# Patient Record
Sex: Male | Born: 1935 | Race: White | Hispanic: No | Marital: Married | Smoking: Former smoker
Health system: Southern US, Community
[De-identification: ages and names within clinical notes are randomized; demographics above are authoritative.]

## PROBLEM LIST (undated history)

## (undated) DIAGNOSIS — H9190 Unspecified hearing loss, unspecified ear: Secondary | ICD-10-CM

## (undated) DIAGNOSIS — H3530 Unspecified macular degeneration: Secondary | ICD-10-CM

## (undated) DIAGNOSIS — Z9889 Other specified postprocedural states: Secondary | ICD-10-CM

## (undated) DIAGNOSIS — N200 Calculus of kidney: Secondary | ICD-10-CM

## (undated) DIAGNOSIS — I10 Essential (primary) hypertension: Secondary | ICD-10-CM

## (undated) DIAGNOSIS — I429 Cardiomyopathy, unspecified: Secondary | ICD-10-CM

## (undated) DIAGNOSIS — R001 Bradycardia, unspecified: Secondary | ICD-10-CM

## (undated) DIAGNOSIS — I493 Ventricular premature depolarization: Secondary | ICD-10-CM

## (undated) DIAGNOSIS — I480 Paroxysmal atrial fibrillation: Secondary | ICD-10-CM

## (undated) DIAGNOSIS — I2582 Chronic total occlusion of coronary artery: Secondary | ICD-10-CM

## (undated) DIAGNOSIS — E785 Hyperlipidemia, unspecified: Secondary | ICD-10-CM

## (undated) DIAGNOSIS — K635 Polyp of colon: Secondary | ICD-10-CM

## (undated) HISTORY — PX: TONSILLECTOMY: SHX5217

## (undated) HISTORY — DX: Hyperlipidemia, unspecified: E78.5

## (undated) HISTORY — DX: Polyp of colon: K63.5

## (undated) HISTORY — DX: Ventricular premature depolarization: I49.3

## (undated) HISTORY — DX: Cardiomyopathy, unspecified: I42.9

## (undated) HISTORY — DX: Essential (primary) hypertension: I10

## (undated) HISTORY — DX: Unspecified hearing loss, unspecified ear: H91.90

## (undated) HISTORY — DX: Calculus of kidney: N20.0

## (undated) HISTORY — DX: Other specified postprocedural states: Z98.890

## (undated) HISTORY — DX: Unspecified macular degeneration: H35.30

## (undated) HISTORY — DX: Bradycardia, unspecified: R00.1

## (undated) SURGERY — CARDIOVERSION
Anesthesia: Monitor Anesthesia Care

---
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
## 1989-08-02 HISTORY — PX: CARDIAC CATHETERIZATION: SHX172

## 2001-08-08 ENCOUNTER — Encounter: Payer: Self-pay | Admitting: Urology

## 2001-08-08 ENCOUNTER — Ambulatory Visit (HOSPITAL_BASED_OUTPATIENT_CLINIC_OR_DEPARTMENT_OTHER): Admission: RE | Admit: 2001-08-08 | Discharge: 2001-08-08 | Payer: Self-pay | Admitting: Urology

## 2001-08-08 HISTORY — PX: CYSTOSCOPY W/ RETROGRADES: SHX1426

## 2001-08-28 ENCOUNTER — Ambulatory Visit (HOSPITAL_BASED_OUTPATIENT_CLINIC_OR_DEPARTMENT_OTHER): Admission: RE | Admit: 2001-08-28 | Discharge: 2001-08-28 | Payer: Self-pay | Admitting: Urology

## 2001-08-28 ENCOUNTER — Encounter: Payer: Self-pay | Admitting: Urology

## 2002-06-21 ENCOUNTER — Encounter: Payer: Self-pay | Admitting: Urology

## 2002-06-21 ENCOUNTER — Ambulatory Visit (HOSPITAL_BASED_OUTPATIENT_CLINIC_OR_DEPARTMENT_OTHER): Admission: RE | Admit: 2002-06-21 | Discharge: 2002-06-21 | Payer: Self-pay | Admitting: Urology

## 2003-07-19 ENCOUNTER — Ambulatory Visit (HOSPITAL_COMMUNITY): Admission: RE | Admit: 2003-07-19 | Discharge: 2003-07-19 | Payer: Self-pay | Admitting: Urology

## 2003-07-19 ENCOUNTER — Ambulatory Visit (HOSPITAL_BASED_OUTPATIENT_CLINIC_OR_DEPARTMENT_OTHER): Admission: RE | Admit: 2003-07-19 | Discharge: 2003-07-19 | Payer: Self-pay | Admitting: Urology

## 2003-07-19 HISTORY — PX: CYSTOSCOPY W/ RETROGRADES: SHX1426

## 2003-07-25 ENCOUNTER — Ambulatory Visit (HOSPITAL_COMMUNITY): Admission: RE | Admit: 2003-07-25 | Discharge: 2003-07-25 | Payer: Self-pay | Admitting: Urology

## 2010-09-01 ENCOUNTER — Encounter: Payer: Self-pay | Admitting: *Deleted

## 2010-09-01 ENCOUNTER — Ambulatory Visit (INDEPENDENT_AMBULATORY_CARE_PROVIDER_SITE_OTHER): Payer: BC Managed Care – PPO | Admitting: Cardiology

## 2010-09-01 ENCOUNTER — Encounter: Payer: Self-pay | Admitting: Cardiology

## 2010-09-01 DIAGNOSIS — I10 Essential (primary) hypertension: Secondary | ICD-10-CM

## 2010-09-01 DIAGNOSIS — I2510 Atherosclerotic heart disease of native coronary artery without angina pectoris: Secondary | ICD-10-CM | POA: Insufficient documentation

## 2010-09-01 DIAGNOSIS — E7800 Pure hypercholesterolemia, unspecified: Secondary | ICD-10-CM | POA: Insufficient documentation

## 2010-09-01 NOTE — Progress Notes (Signed)
Subjective:   Christian Gillespie is seen today for a one-year followup visit. He has known ischemic heart disease and had remote catheterization in 1991 which showed a totally occluded right coronary artery and somewhat diffuse disease in the left system. He has not allow me to do followup catheterization since that time. Her last stress test was in 1998 and he has remained asymptomatic and continues to do well. He has been on pravastatin therapy  Current Outpatient Prescriptions  Medication Sig Dispense Refill  . amLODipine (NORVASC) 5 MG tablet Take 5 mg by mouth daily.        Marland Kitchen aspirin 81 MG tablet Take 81 mg by mouth daily.        Marland Kitchen atenolol (TENORMIN) 50 MG tablet Take 50 mg by mouth daily.        . pravastatin (PRAVACHOL) 80 MG tablet Take 80 mg by mouth daily.        Marland Kitchen DISCONTD: niacin (NIASPAN) 500 MG CR tablet Take 500 mg by mouth at bedtime.          Allergies  Allergen Reactions  . Codeine     There is no problem list on file for this patient.   History  Smoking status  . Former Smoker  . Types: Cigarettes  . Quit date: 03/22/1985  Smokeless tobacco  . Never Used    History  Alcohol Use No    Family History  Problem Relation Age of Onset  . Breast cancer Mother     Review of Systems:   The patient denies any heat or cold intolerance.  No weight gain or weight loss.  The patient denies headaches or blurry vision.  There is no cough or sputum production.  The patient denies dizziness.  There is no hematuria or hematochezia.  The patient denies any muscle aches or arthritis.  The patient denies any rash.  The patient denies frequent falling or instability.  There is no history of depression or anxiety.  All other systems were reviewed and are negative.   Physical Exam:   Weight is 192. Blood pressure 120/62. Heart rate 60.The head is normocephalic and atraumatic.  Pupils are equally round and reactive to light.  Sclerae nonicteric.  Conjunctiva is clear.  Oropharynx is  unremarkable.  There's adequate oral airway.  Neck is supple there are no masses.  Thyroid is not enlarged.  There is no lymphadenopathy.  Lungs are clear.  Chest is symmetric.  Heart shows a regular rate and rhythm.  S1 and S2 are normal.  There is no murmur click or gallop.  Abdomen is soft normal bowel sounds.  There is no organomegaly.  Genital and rectal deferred.  Extremities are without edema.  Peripheral pulses are adequate.  Neurologically intact.  Full range of motion.  The patient is not depressed.  Skin is warm and dry.  Assessment / Plan:

## 2010-09-01 NOTE — Assessment & Plan Note (Signed)
Continue pravastatin. Primary care for followup lab.

## 2010-09-01 NOTE — Assessment & Plan Note (Signed)
We'll continue current medications. I'll have him see Dr. Shirlee Latch in one year.

## 2010-09-03 ENCOUNTER — Ambulatory Visit: Payer: Self-pay | Admitting: Cardiology

## 2010-09-04 ENCOUNTER — Encounter: Payer: Self-pay | Admitting: Cardiology

## 2011-08-03 ENCOUNTER — Encounter: Payer: Self-pay | Admitting: *Deleted

## 2011-08-17 ENCOUNTER — Encounter: Payer: Self-pay | Admitting: Cardiology

## 2011-08-17 ENCOUNTER — Ambulatory Visit (INDEPENDENT_AMBULATORY_CARE_PROVIDER_SITE_OTHER): Payer: Medicare Other | Admitting: Cardiology

## 2011-08-17 VITALS — BP 142/70 | HR 60 | Ht 69.0 in | Wt 195.0 lb

## 2011-08-17 DIAGNOSIS — I10 Essential (primary) hypertension: Secondary | ICD-10-CM

## 2011-08-17 DIAGNOSIS — E7800 Pure hypercholesterolemia, unspecified: Secondary | ICD-10-CM

## 2011-08-17 DIAGNOSIS — I2510 Atherosclerotic heart disease of native coronary artery without angina pectoris: Secondary | ICD-10-CM

## 2011-08-17 NOTE — Patient Instructions (Signed)
Ask Dr Tenny Craw to send Dr Shirlee Latch a copy of your lab when you have your physical exam. Dr Shirlee Latch fax [phone]  Your physician wants you to follow-up in: 1 year with Dr Shirlee Latch.(May 2014).You will receive a reminder letter in the mail two months in advance. If you don't receive a letter, please call our office to schedule the follow-up appointment.

## 2011-08-19 NOTE — Assessment & Plan Note (Signed)
BP minimally elevated today but has been within normal range at home.  If he needs an additional agent in the future, would use ACEI.

## 2011-08-19 NOTE — Progress Notes (Signed)
PCP: Dr. Tenny Craw  78 yo with history of CAD s/p cath in 1991 showing a totally occluded RCA with no intervention presents today for cardiology followup.  He has been seen by Dr. Deborah Chalk in the past and is seen by me for the first time today.  He has been doing well recently.  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  No chest pain.  He does a lot of walking and denies exertional dyspnea.  No orthopnea/PND.  BP is mildly elevated today but has been running in the 120s-130s systolic at other checks.    ECG: NSR, iRBBB, right axis deviation, old inferior MI with inferior T wave inversions  PMH: 1. HTN 2. Hyperlipidemia 3. CAD: LHC 1991 with totally occluded RCA.  No intervention.   SH: Quit smoking in 1987.  Lives in [place].  Married.  Education officer, environmental at [employer].   FH: No premature CAD.   ROS: All systems reviewed and negative except as noted in HPI.   Current Outpatient Prescriptions  Medication Sig Dispense Refill  . amLODipine (NORVASC) 5 MG tablet Take 5 mg by mouth daily.        Marland Kitchen aspirin 81 MG tablet Take 81 mg by mouth daily.        Marland Kitchen atenolol (TENORMIN) 50 MG tablet Take 50 mg by mouth daily.        . Nutritional Supplements (PROSTA VITE PO) Take by mouth daily.      . pravastatin (PRAVACHOL) 80 MG tablet Take 80 mg by mouth daily.          BP 142/70  Pulse 60  Ht 5\' 9"  (1.753 m)  Wt 88.451 kg (195 lb)  BMI 28.80 kg/m2 General: NAD Neck: No JVD, no thyromegaly or thyroid nodule.  Lungs: Clear to auscultation bilaterally with normal respiratory effort. CV: Nondisplaced PMI.  Heart regular S1/S2, no S3/S4, no murmur.  Trace ankle edema.  No carotid bruit.  Normal pedal pulses.  Abdomen: Soft, nontender, no hepatosplenomegaly, no distention.  Neurologic: Alert and oriented x 3.  Psych: Normal affect. Extremities: No clubbing or cyanosis.

## 2011-08-19 NOTE — Assessment & Plan Note (Signed)
Totally occluded RCA on cath in 1991.  No intervention.  He has done well since that time with no chest pain.  Continue ASA 81, beta blocker, pravastatin.

## 2011-08-19 NOTE — Assessment & Plan Note (Signed)
He has a physical coming up with PCP and will send [place] a copy of the labs.  Goal LDL < 70.

## 2011-08-24 NOTE — Progress Notes (Signed)
Addended by: Emilyn Ruble L on: 08/24/2011 07:56 PM   Modules accepted: Orders  

## 2013-04-25 ENCOUNTER — Ambulatory Visit: Payer: Medicare Other | Admitting: Cardiology

## 2013-04-25 ENCOUNTER — Ambulatory Visit: Payer: Medicare Other | Admitting: Physician Assistant

## 2013-04-26 ENCOUNTER — Telehealth: Payer: Self-pay | Admitting: Cardiology

## 2013-04-26 NOTE — Telephone Encounter (Signed)
New problem   Pt have no energy and sob.

## 2013-04-26 NOTE — Telephone Encounter (Signed)
Christian Gillespie calls to schedule an appointment with Dr. Aundra Dubin & to have an EKG ( b/c it has been awhile acc. To pt)  States he has had some shortness of breath for the past couple of days since getting over the flu. He had seen his pcp & states he was on antibiotics but not now.  In addition to the shortness of breath which is not acute, states he has not been able to sleep well at night, decreased appetite, weight loss, decreased strength since having the flu.  He is drinking ensure & taking other fluids well. Denies any fever, angina, chest pain, or weight gain. Denies nausea & vomiting  appt made for pt on 04/30/13 with Dr. Aundra Dubin  Pt agrees with this plan. Reassurance given & is not in distress at this time. Horton Chin RN

## 2013-04-27 ENCOUNTER — Telehealth: Payer: Self-pay | Admitting: Cardiology

## 2013-04-27 NOTE — Telephone Encounter (Signed)
I spoke with pt wife, Everlene Farrier, pt was short of breath after going out to start the car for her this morning.  Wife states she asked him not to go out & he would not wear a scarf. He has recently been sick with the flu. He is not short of breath now.  See previous telephone note from yesterday. Pt will keep follow-up appointment with Dr. Aundra Dubin on Monday. If his symptoms worsen advised to call 911. Denies any angina or chest pain.  Horton Chin RN

## 2013-04-27 NOTE — Telephone Encounter (Signed)
New problem ° ° ° °Pt having SOB.. °

## 2013-04-30 ENCOUNTER — Encounter: Payer: Self-pay | Admitting: Cardiology

## 2013-04-30 ENCOUNTER — Encounter: Payer: Self-pay | Admitting: *Deleted

## 2013-04-30 ENCOUNTER — Ambulatory Visit (INDEPENDENT_AMBULATORY_CARE_PROVIDER_SITE_OTHER): Payer: Medicare Other | Admitting: Cardiology

## 2013-04-30 VITALS — BP 125/89 | HR 118 | Ht 69.0 in | Wt 188.4 lb

## 2013-04-30 DIAGNOSIS — I2510 Atherosclerotic heart disease of native coronary artery without angina pectoris: Secondary | ICD-10-CM

## 2013-04-30 DIAGNOSIS — E7800 Pure hypercholesterolemia, unspecified: Secondary | ICD-10-CM

## 2013-04-30 DIAGNOSIS — I480 Paroxysmal atrial fibrillation: Secondary | ICD-10-CM | POA: Insufficient documentation

## 2013-04-30 DIAGNOSIS — I4891 Unspecified atrial fibrillation: Secondary | ICD-10-CM

## 2013-04-30 DIAGNOSIS — I509 Heart failure, unspecified: Secondary | ICD-10-CM

## 2013-04-30 LAB — LIPID PANEL
CHOLESTEROL: 122 mg/dL (ref 0–200)
HDL: 34.5 mg/dL — AB (ref >39.00)
LDL Cholesterol: 75 mg/dL (ref 0–99)
Total CHOL/HDL Ratio: 4
Triglycerides: 63 mg/dL (ref 0.0–149.0)
VLDL: 12.6 mg/dL (ref 0.0–40.0)

## 2013-04-30 LAB — BASIC METABOLIC PANEL
BUN: 32 mg/dL — ABNORMAL HIGH (ref 6–23)
CHLORIDE: 108 meq/L (ref 96–112)
CO2: 25 meq/L (ref 19–32)
Calcium: 9.1 mg/dL (ref 8.4–10.5)
Creatinine, Ser: 2.2 mg/dL — ABNORMAL HIGH (ref 0.4–1.5)
GFR: 31.15 mL/min — AB (ref >60.00)
GLUCOSE: 94 mg/dL (ref 70–99)
POTASSIUM: 4.5 meq/L (ref 3.5–5.1)
SODIUM: 140 meq/L (ref 135–145)

## 2013-04-30 LAB — CBC WITH DIFFERENTIAL/PLATELET
Basophils Absolute: 0 10*3/uL (ref 0.0–0.1)
Basophils Relative: 0.4 % (ref 0.0–3.0)
EOS ABS: 0.1 10*3/uL (ref 0.0–0.7)
Eosinophils Relative: 2.2 % (ref 0.0–5.0)
HCT: 37.7 % — ABNORMAL LOW (ref 39.0–52.0)
Hemoglobin: 12.1 g/dL — ABNORMAL LOW (ref 13.0–17.0)
LYMPHS PCT: 28 % (ref 12.0–46.0)
Lymphs Abs: 1.4 10*3/uL (ref 0.7–4.0)
MCHC: 32 g/dL (ref 30.0–36.0)
MCV: 95.6 fl (ref 78.0–100.0)
MONO ABS: 0.6 10*3/uL (ref 0.1–1.0)
Monocytes Relative: 12.3 % — ABNORMAL HIGH (ref 3.0–12.0)
NEUTROS PCT: 57.1 % (ref 43.0–77.0)
Neutro Abs: 2.9 10*3/uL (ref 1.4–7.7)
Platelets: 120 10*3/uL — ABNORMAL LOW (ref 150.0–400.0)
RBC: 3.95 Mil/uL — AB (ref 4.22–5.81)
RDW: 15 % — ABNORMAL HIGH (ref 11.5–14.6)
WBC: 5.8 10*3/uL (ref 4.5–10.5)

## 2013-04-30 LAB — BRAIN NATRIURETIC PEPTIDE: Pro B Natriuretic peptide (BNP): 692 pg/mL — ABNORMAL HIGH (ref 0.0–100.0)

## 2013-04-30 MED ORDER — FUROSEMIDE 20 MG PO TABS: 20.0000 mg | ORAL_TABLET | Freq: Every day | ORAL | Status: DC

## 2013-04-30 MED ORDER — RIVAROXABAN 20 MG PO TABS
20.0000 mg | ORAL_TABLET | Freq: Every day | ORAL | Status: DC
Start: 2013-04-30 — End: 2013-05-01

## 2013-04-30 MED ORDER — AMLODIPINE BESYLATE 2.5 MG PO TABS: 2.5000 mg | ORAL_TABLET | Freq: Every day | ORAL | Status: DC

## 2013-04-30 MED ORDER — POTASSIUM CHLORIDE ER 10 MEQ PO TBCR: 10.0000 meq | EXTENDED_RELEASE_TABLET | Freq: Every day | ORAL | Status: DC

## 2013-04-30 MED ORDER — ATENOLOL 50 MG PO TABS: 50.0000 mg | ORAL_TABLET | Freq: Two times a day (BID) | ORAL | Status: DC

## 2013-04-30 NOTE — Progress Notes (Signed)
Patient ID: Christian Gillespie, male   DOB: 1925-12-30, 80 y.o.   MRN: [id number] PCP: Dr. Harrington Challenger  79 yo with history of CAD s/p cath in 1991 showing a totally occluded RCA with no intervention presents today for cardiology followup.  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  About 3 wks ago, he developed flu-like symptoms and has still not recovered.  No fever but he feels like he has no energy.  He is short of breath walking up steps or walking fast, which is new for him.  He has developed orthopnea.  He had an episode of chest pain last week in his lower central chest when he was out in the cold air. This resolved after 4-5 minutes.  Patient was noted by exam and ECG to be in atrial fibrillation today.  This has not been documented in the past.   ECG: Atrial fibrillation at 118 bpm, PVC, inferior and anterolateral T wave inversions  PMH: 1. HTN 2. Hyperlipidemia 3. CAD: LHC 1991 with totally occluded RCA.  No intervention.  4. Atrial fibrillation: Diagnosed in 2/15.   SH: Quit smoking in 1987.  Lives in [place].  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   ROS: All systems reviewed and negative except as noted in HPI.   Current Outpatient Prescriptions  Medication Sig Dispense Refill  . Nutritional Supplements (PROSTA VITE PO) Take by mouth daily.      . pravastatin (PRAVACHOL) 80 MG tablet Take 80 mg by mouth daily.        Marland Kitchen amLODipine (NORVASC) 2.5 MG tablet Take 1 tablet (2.5 mg total) by mouth daily.  30 tablet  3  . atenolol (TENORMIN) 50 MG tablet Take 1 tablet (50 mg total) by mouth 2 (two) times daily.  60 tablet  3  . furosemide (LASIX) 20 MG tablet Take 1 tablet (20 mg total) by mouth daily.  30 tablet  3  . potassium chloride (K-DUR) 10 MEQ tablet Take 1 tablet (10 mEq total) by mouth daily.  30 tablet  3  . Rivaroxaban (XARELTO) 20 MG TABS tablet Take 1 tablet (20 mg total) by mouth daily with supper.  30 tablet  1   No current  facility-administered medications for this visit.    BP 125/89  Pulse 118  Ht 5\' 9"  (1.753 m)  Wt 85.458 kg (188 lb 6.4 oz)  BMI 27.81 kg/m2 General: NAD Neck: JVP 8-9 cm, no thyromegaly or thyroid nodule.  Lungs: Clear to auscultation bilaterally with normal respiratory effort. CV: Nondisplaced PMI.  Heart tachy, irregular S1/S2, no S3/S4, no murmur.  1+ edema 1/2 up lower legs bilaterally.  No carotid bruit.  Normal pedal pulses.  Abdomen: Soft, nontender, no hepatosplenomegaly, no distention.  Neurologic: Alert and oriented x 3.  Psych: Normal affect. Extremities: No clubbing or cyanosis.   Assessment/Plan: 1. Atrial fibrillation: With RVR.  This is a new finding for Christian Gillespie.  I suspect that he probably went into atrial fibrillation 2-3 weeks ago and this was the cause of his new symptoms over that period.  He is volume overloaded today with NYHA class III symptoms. He feels badly.  I think that we need to try to get him out of atrial fibrillation sooner rather than later. CHADSVASC = 4.  - Start Xarelto 20 mg daily for CVA prevention.  - Increase atenolol to 50 mg bid for rate control (will decrease amlodipine to 2.5 mg daily with  increase in atenolol).   - Need to check BMET and CBC.   - I will arrange TEE-guided cardioversion after 3 doses of Xarelto.  2. Acute CHF: Patient is volume overloaded on exam with NYHA class III symptoms.  Suspect the onset of atrial fibrillation may have triggered the CHF exacerbation.  - I will assess his EF on TEE later this week.  - Start Lasix 40 mg daily x 2 days then 20 mg daily.  KCl 10 mEq daily.  - BMET today and in 2 wks.  3. CAD: Patient has had atypical chest pain and also has CHF symptoms.  I will arrange for Lexiscan Cardiolite after cardioversion is completed.  He can stop ASA 81 as he will be anticoagulated.  4. Hyperlipidemia: Continue statin.   [name] 04/30/2013

## 2013-04-30 NOTE — Patient Instructions (Signed)
Decrease amlodipine to 2.5mg  daily.   Increase atenolol to 50mg  two times a day.   Start lasix(furosemide) 40 mg daily for 2 days, then decrease to 20mg  daily. This will be 2 of your 20mg  tablets daily for 2 days, then 1 of your 20mg  tablets daily.  Start KCL(potassium) 10 mEq daily.  Stop aspirin.   Start Xarelto 20mg  daily.  Your physician recommends that you have  lab work today--BMET/BNP/CBCd/Lipid profile/   Your physician has requested that you have a [place]. For further information please visit [email or web address]. Please follow instruction sheet, as given.  Your physician has requested that you have a TEE/Cardioversion. During a TEE, sound waves are used to create images of your heart. It provides your doctor with information about the size and shape of your heart and how well your heart's chambers and valves are working. In this test, a transducer is attached to the end of a flexible tube that is guided down you throat and into your esophagus (the tube leading from your mouth to your stomach) to get a more detailed image of your heart. Once the TEE has determined that a blood clot is not present, the cardioversion begins. Electrical Cardioversion uses a jolt of electricity to your heart either through paddles or wired patches attached to your chest. This is a controlled, usually prescheduled, procedure. This procedure is done at the hospital and you are not awake during the procedure. You usually go home the day of the procedure. Please see the instruction sheet given to you today for more information. Friday February 13,2015.   Your physician recommends that you schedule a follow-up appointment in: 3-4 weeks with Dr Aundra Dubin.

## 2013-05-01 ENCOUNTER — Telehealth: Payer: Self-pay | Admitting: *Deleted

## 2013-05-01 ENCOUNTER — Ambulatory Visit: Payer: Medicare Other

## 2013-05-01 DIAGNOSIS — I2510 Atherosclerotic heart disease of native coronary artery without angina pectoris: Secondary | ICD-10-CM

## 2013-05-01 DIAGNOSIS — I509 Heart failure, unspecified: Secondary | ICD-10-CM

## 2013-05-01 DIAGNOSIS — I4891 Unspecified atrial fibrillation: Secondary | ICD-10-CM

## 2013-05-01 DIAGNOSIS — E7800 Pure hypercholesterolemia, unspecified: Secondary | ICD-10-CM

## 2013-05-01 LAB — TSH: TSH: 3.15 u[IU]/mL (ref 0.35–5.50)

## 2013-05-01 MED ORDER — METOPROLOL SUCCINATE ER 50 MG PO TB24: 50.0000 mg | ORAL_TABLET | Freq: Two times a day (BID) | ORAL | Status: DC

## 2013-05-01 MED ORDER — RIVAROXABAN 15 MG PO TABS: 15.0000 mg | ORAL_TABLET | Freq: Every day | ORAL | Status: DC

## 2013-05-01 NOTE — Telephone Encounter (Signed)
Done

## 2013-05-04 ENCOUNTER — Encounter (HOSPITAL_COMMUNITY): Payer: Self-pay

## 2013-05-04 ENCOUNTER — Encounter (HOSPITAL_COMMUNITY)
Admission: RE | Disposition: A | Discharge status: Home / Self Care | Payer: Medicare Other | Source: Ambulatory Visit | Attending: Cardiology

## 2013-05-04 ENCOUNTER — Ambulatory Visit (HOSPITAL_COMMUNITY)
Admission: RE | Admit: 2013-05-04 | Discharge: 2013-05-04 | Disposition: A | Discharge status: Home / Self Care | Payer: Medicare Other | Source: Ambulatory Visit | Attending: Cardiology | Admitting: Cardiology

## 2013-05-04 ENCOUNTER — Other Ambulatory Visit: Payer: Self-pay | Admitting: Cardiology

## 2013-05-04 ENCOUNTER — Encounter (HOSPITAL_COMMUNITY): Payer: Self-pay | Admitting: Pharmacy Technician

## 2013-05-04 ENCOUNTER — Encounter (HOSPITAL_COMMUNITY)
Admission: RE | Disposition: A | Discharge status: Home / Self Care | Payer: Self-pay | Source: Ambulatory Visit | Attending: Cardiology

## 2013-05-04 ENCOUNTER — Ambulatory Visit (HOSPITAL_COMMUNITY): Payer: Medicare Other | Admitting: Certified Registered Nurse Anesthetist

## 2013-05-04 ENCOUNTER — Encounter (HOSPITAL_COMMUNITY): Payer: Medicare Other | Admitting: Certified Registered Nurse Anesthetist

## 2013-05-04 DIAGNOSIS — I4891 Unspecified atrial fibrillation: Secondary | ICD-10-CM

## 2013-05-04 DIAGNOSIS — I10 Essential (primary) hypertension: Secondary | ICD-10-CM | POA: Insufficient documentation

## 2013-05-04 DIAGNOSIS — I509 Heart failure, unspecified: Secondary | ICD-10-CM

## 2013-05-04 DIAGNOSIS — E785 Hyperlipidemia, unspecified: Secondary | ICD-10-CM | POA: Insufficient documentation

## 2013-05-04 DIAGNOSIS — I059 Rheumatic mitral valve disease, unspecified: Secondary | ICD-10-CM

## 2013-05-04 DIAGNOSIS — Z7901 Long term (current) use of anticoagulants: Secondary | ICD-10-CM | POA: Insufficient documentation

## 2013-05-04 DIAGNOSIS — Z87891 Personal history of nicotine dependence: Secondary | ICD-10-CM | POA: Insufficient documentation

## 2013-05-04 DIAGNOSIS — I2510 Atherosclerotic heart disease of native coronary artery without angina pectoris: Secondary | ICD-10-CM | POA: Insufficient documentation

## 2013-05-04 HISTORY — PX: TEE WITHOUT CARDIOVERSION: SHX5443

## 2013-05-04 HISTORY — PX: CARDIOVERSION: SHX1299

## 2013-05-04 SURGERY — ECHOCARDIOGRAM, TRANSESOPHAGEAL
Anesthesia: Moderate Sedation

## 2013-05-04 SURGERY — ECHOCARDIOGRAM, TRANSESOPHAGEAL
Anesthesia: Monitor Anesthesia Care

## 2013-05-04 SURGERY — CARDIOVERSION
Anesthesia: Monitor Anesthesia Care

## 2013-05-04 MED ORDER — PROPOFOL 10 MG/ML IV BOLUS
INTRAVENOUS | Status: DC | PRN
  Administered 2013-05-04: 70 mg via INTRAVENOUS

## 2013-05-04 MED ORDER — SODIUM CHLORIDE 0.9 % IV SOLN
INTRAVENOUS | Status: DC
  Administered 2013-05-04: 500 mL via INTRAVENOUS

## 2013-05-04 MED ORDER — HYDRALAZINE HCL 20 MG/ML IJ SOLN
INTRAMUSCULAR | Status: AC
  Filled 2013-05-04: qty 1

## 2013-05-04 MED ORDER — FENTANYL CITRATE 0.05 MG/ML IJ SOLN
INTRAMUSCULAR | Status: AC
  Filled 2013-05-04: qty 2

## 2013-05-04 MED ORDER — MIDAZOLAM HCL 5 MG/ML IJ SOLN
INTRAMUSCULAR | Status: AC
  Filled 2013-05-04: qty 2

## 2013-05-04 MED ORDER — LIDOCAINE HCL (CARDIAC) 20 MG/ML IV SOLN
INTRAVENOUS | Status: DC | PRN
  Administered 2013-05-04: 40 mg via INTRAVENOUS

## 2013-05-04 MED ORDER — BUTAMBEN-TETRACAINE-BENZOCAINE 2-2-14 % EX AERO
INHALATION_SPRAY | CUTANEOUS | Status: DC | PRN
  Administered 2013-05-04: 2 via TOPICAL

## 2013-05-04 MED ORDER — AMIODARONE IV BOLUS ONLY 150 MG/100ML
150.0000 mg | Freq: Once | INTRAVENOUS | Status: AC
  Administered 2013-05-04: 150 mg via INTRAVENOUS

## 2013-05-04 MED ORDER — METOPROLOL TARTRATE 1 MG/ML IV SOLN
INTRAVENOUS | Status: DC | PRN
  Administered 2013-05-04: 5 mg via INTRAVENOUS

## 2013-05-04 MED ORDER — FENTANYL CITRATE 0.05 MG/ML IJ SOLN
INTRAMUSCULAR | Status: DC | PRN
  Administered 2013-05-04: 25 ug via INTRAVENOUS

## 2013-05-04 MED ORDER — MIDAZOLAM HCL 10 MG/2ML IJ SOLN
INTRAMUSCULAR | Status: DC | PRN
  Administered 2013-05-04: 2 mg via INTRAVENOUS

## 2013-05-04 MED ORDER — METOPROLOL TARTRATE 1 MG/ML IV SOLN
INTRAVENOUS | Status: AC
  Filled 2013-05-04: qty 5

## 2013-05-04 NOTE — Transfer of Care (Signed)
Immediate Anesthesia Transfer of Care Note  Patient: Christian Gillespie  Procedure(s) Performed: Procedure(s): TRANSESOPHAGEAL ECHOCARDIOGRAM (TEE) (N/A) CARDIOVERSION (N/A)  Patient Location: PACU  Anesthesia Type:General  Level of Consciousness: awake, alert  and oriented  Airway & Oxygen Therapy: Patient Spontanous Breathing and Patient connected to nasal cannula oxygen  Post-op Assessment: Report given to PACU RN and Post -op Vital signs reviewed and stable  Post vital signs: Reviewed and stable  Complications: No apparent anesthesia complications

## 2013-05-04 NOTE — Anesthesia Preprocedure Evaluation (Addendum)
Anesthesia Evaluation  Patient identified by MRN, date of birth, ID band Patient awake    Reviewed: Allergy & Precautions, H&P , NPO status , Patient's Chart, lab work & pertinent test results, reviewed documented beta blocker date and time   Airway Mallampati: II      Dental  (+) Teeth Intact   Pulmonary former smoker,  breath sounds clear to auscultation        Cardiovascular hypertension, + CAD and + Past MI Rhythm:Irregular Rate:Tachycardia     Neuro/Psych    GI/Hepatic   Endo/Other    Renal/GU      Musculoskeletal   Abdominal   Peds  Hematology   Anesthesia Other Findings   Reproductive/Obstetrics                          Anesthesia Physical Anesthesia Plan  ASA: III  Anesthesia Plan: MAC and General   Post-op Pain Management:    Induction: Intravenous  Airway Management Planned: Mask  Additional Equipment:   Intra-op Plan:   Post-operative Plan:   Informed Consent: I have reviewed the patients History and Physical, chart, labs and discussed the procedure including the risks, benefits and alternatives for the proposed anesthesia with the patient or authorized representative who has indicated his/her understanding and acceptance.   Dental advisory given  Plan Discussed with: CRNA and Anesthesiologist  Anesthesia Plan Comments:         Anesthesia Quick Evaluation

## 2013-05-04 NOTE — Interval H&P Note (Signed)
History and Physical Interval Note:  05/04/2013 9:29 AM  Christian Gillespie  has presented today for surgery, with the diagnosis of afib  The various methods of treatment have been discussed with the patient and family. After consideration of risks, benefits and other options for treatment, the patient has consented to  Procedure(s): TRANSESOPHAGEAL ECHOCARDIOGRAM (TEE) (N/A) CARDIOVERSION (N/A) as a surgical intervention .  The patient's history has been reviewed, patient examined, no change in status, stable for surgery.  I have reviewed the patient's chart and labs.  Questions were answered to the patient's satisfaction.     Treanna Dumler [employer]

## 2013-05-04 NOTE — Progress Notes (Signed)
  Echocardiogram Echocardiogram Transesophageal has been performed.  Philipp Deputy 05/04/2013, 11:50 AM

## 2013-05-04 NOTE — CV Procedure (Signed)
Procedure: TEE  Indication: Atrial fibrillation with RVR.  He has had 4 doses of Xarelto including today.    Sedation: Versed 2 mg IV, Fentanyl 25 mcg IV  Findings: Please see TEE section of chart for full report. Normal LV size.  EF 30%, global hypokinesis appearing worse in the septum.  Normal RV size with mildly decreased systolic function. There was moderate central MR.  No LAA thrombus.  There was a PFO.    OK for cardioversion.   [name] 05/04/2013 10:02 AM

## 2013-05-04 NOTE — Anesthesia Postprocedure Evaluation (Signed)
  Anesthesia Post-op Note  Patient: Christian Gillespie  Procedure(s) Performed: Procedure(s): TRANSESOPHAGEAL ECHOCARDIOGRAM (TEE) (N/A) CARDIOVERSION (N/A)  Patient Location: Endoscopy Unit  Anesthesia Type:General  Level of Consciousness: awake  Airway and Oxygen Therapy: Patient Spontanous Breathing  Post-op Pain: none  Post-op Assessment: Post-op Vital signs reviewed  Post-op Vital Signs: stable  Complications: No apparent anesthesia complications

## 2013-05-04 NOTE — Discharge Instructions (Signed)
Monitored Anesthesia Care  Monitored anesthesia care is an anesthesia service for a medical procedure. Anesthesia is the loss of the ability to feel pain. It is produced by medications called anesthetics. It may affect a small area of your body (local anesthesia), a large area of your body (regional anesthesia), or your entire body (general anesthesia). The need for monitored anesthesia care depends your procedure, your condition, and the potential need for regional or general anesthesia. It is often provided during procedures where:   General anesthesia may be needed if there are complications. This is because you need special care when you are under general anesthesia.   You will be under local or regional anesthesia. This is so that you are able to have higher levels of anesthesia if needed.   You will receive calming medications (sedatives). This is especially the case if sedatives are given to put you in a semi-conscious state of relaxation (deep sedation). This is because the amount of sedative needed to produce this state can be hard to predict. Too much of a sedative can produce general anesthesia. Monitored anesthesia care is performed by one or more caregivers who have special training in all types of anesthesia. You will need to meet with these caregivers before your procedure. During this meeting, they will ask you about your medical history. They will also give you instructions to follow. (For example, you will need to stop eating and drinking before your procedure. You may also need to stop or change medications you are taking.) During your procedure, your caregivers will stay with you. They will:   Watch your condition. This includes watching you blood pressure, breathing, and level of pain.   Diagnose and treat problems that occur.   Give medications if they are needed. These may include calming medications (sedatives) and anesthetics.   Make sure you are comfortable.  Having  monitored anesthesia care does not necessarily mean that you will be under anesthesia. It does mean that your caregivers will be able to manage anesthesia if you need it or if it occurs. It also means that you will be able to have a different type of anesthesia than you are having if you need it. When your procedure is complete, your caregivers will continue to watch your condition. They will make sure any medications wear off before you are allowed to go home.  Document Released: 12/02/2004 Document Revised: 07/03/2012 Document Reviewed: 04/19/2012 [facility] Patient Information 2014 [place], [place].  Conscious Sedation, Adult, Care After Refer to this sheet in the next few weeks. These instructions provide you with information on caring for yourself after your procedure. Your health care provider may also give you more specific instructions. Your treatment has been planned according to current medical practices, but problems sometimes occur. Call your health care provider if you have any problems or questions after your procedure. WHAT TO EXPECT AFTER THE PROCEDURE  After your procedure:  You may feel sleepy, clumsy, and have poor balance for several hours.  Vomiting may occur if you eat too soon after the procedure. HOME CARE INSTRUCTIONS  Do not participate in any activities where you could become injured for at least 24 hours. Do not:  Drive.  Swim.  Ride a bicycle.  Operate heavy machinery.  Cook.  Use power tools.  Climb ladders.  Work from a high place.  Do not make important decisions or sign legal documents until you are improved.  If you vomit, drink water, juice, or soup when you can  drink without vomiting. Make sure you have little or no nausea before eating solid foods.  Only take over-the-counter or prescription medicines for pain, discomfort, or fever as directed by your health care provider.  Make sure you and your family fully understand everything about the  medicines given to you, including what side effects may occur.  You should not drink alcohol, take sleeping pills, or take medicines that cause drowsiness for at least 24 hours.  If you smoke, do not smoke without supervision.  If you are feeling better, you may resume normal activities 24 hours after you were sedated.  Keep all appointments with your health care provider. SEEK MEDICAL CARE IF:  Your skin is pale or bluish in color.  You continue to feel nauseous or vomit.  Your pain is getting worse and is not helped by medicine.  You have bleeding or swelling.  You are still sleepy or feeling clumsy after 24 hours. SEEK IMMEDIATE MEDICAL CARE IF:  You develop a rash.  You have difficulty breathing.  You develop any type of allergic problem.  You have a fever. MAKE SURE YOU:  Understand these instructions.  Will watch your condition.  Will get help right away if you are not doing well or get worse. Document Released: 12/27/2012 Document Reviewed: 10/13/2012 [facility] Patient Information 2014 [place], [place].  Electrical Cardioversion, Care After Refer to this sheet in the next few weeks. These instructions provide you with information on caring for yourself after your procedure. Your health care provider may also give you more specific instructions. Your treatment has been planned according to current medical practices, but problems sometimes occur. Call your health care provider if you have any problems or questions after your procedure. WHAT TO EXPECT AFTER THE PROCEDURE After your procedure, it is typical to have the following sensations:  Some redness on the skin where the shocks were delivered. If this is tender, a sunburn lotion or hydrocortisone cream may help.  Possible return of an abnormal heart rhythm within hours or days after the procedure. HOME CARE INSTRUCTIONS  Only take medicine as directed by your health care provider. Be sure you understand how and  when to take your medicine.  Learn how to feel your pulse and check it often.  Limit your activity for 48 hours after the procedure or as directed.  Avoid or minimize caffeine and other stimulants as directed. SEEK MEDICAL CARE IF:  You feel like your heart is beating too fast or your pulse is not regular.  You have any questions about your medicines.  You have bleeding that will not stop. SEEK IMMEDIATE MEDICAL CARE IF:  You are dizzy or feel faint.  It is hard to breathe or you feel short of breath.  There is a change in discomfort in your chest.  Your speech is slurred or you have trouble moving an arm or leg on one side of your body.  You get a serious muscle cramp that does not go away.  Your fingers or toes turn cold or blue. MAKE SURE YOU:   Understand these instructions.   Will watch your condition.   Will get help right away if you are not doing well or get worse. Document Released: 12/27/2012 Document Reviewed: 09/20/2012 [facility] Patient Information 2014 [place], [place].

## 2013-05-04 NOTE — Preoperative (Signed)
Beta Blockers   Reason not to administer Beta Blockers:Not Applicable 

## 2013-05-04 NOTE — H&P (View-Only) (Signed)
Patient ID: Christian Gillespie, male   DOB: 1925-12-30, 80 y.o.   MRN: [id number] PCP: Dr. Harrington Challenger  79 yo with history of CAD s/p cath in 1991 showing a totally occluded RCA with no intervention presents today for cardiology followup.  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  About 3 wks ago, he developed flu-like symptoms and has still not recovered.  No fever but he feels like he has no energy.  He is short of breath walking up steps or walking fast, which is new for him.  He has developed orthopnea.  He had an episode of chest pain last week in his lower central chest when he was out in the cold air. This resolved after 4-5 minutes.  Patient was noted by exam and ECG to be in atrial fibrillation today.  This has not been documented in the past.   ECG: Atrial fibrillation at 118 bpm, PVC, inferior and anterolateral T wave inversions  PMH: 1. HTN 2. Hyperlipidemia 3. CAD: LHC 1991 with totally occluded RCA.  No intervention.  4. Atrial fibrillation: Diagnosed in 2/15.   SH: Quit smoking in 1987.  Lives in [place].  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   ROS: All systems reviewed and negative except as noted in HPI.   Current Outpatient Prescriptions  Medication Sig Dispense Refill  . Nutritional Supplements (PROSTA VITE PO) Take by mouth daily.      . pravastatin (PRAVACHOL) 80 MG tablet Take 80 mg by mouth daily.        Marland Kitchen amLODipine (NORVASC) 2.5 MG tablet Take 1 tablet (2.5 mg total) by mouth daily.  30 tablet  3  . atenolol (TENORMIN) 50 MG tablet Take 1 tablet (50 mg total) by mouth 2 (two) times daily.  60 tablet  3  . furosemide (LASIX) 20 MG tablet Take 1 tablet (20 mg total) by mouth daily.  30 tablet  3  . potassium chloride (K-DUR) 10 MEQ tablet Take 1 tablet (10 mEq total) by mouth daily.  30 tablet  3  . Rivaroxaban (XARELTO) 20 MG TABS tablet Take 1 tablet (20 mg total) by mouth daily with supper.  30 tablet  1   No current  facility-administered medications for this visit.    BP 125/89  Pulse 118  Ht 5\' 9"  (1.753 m)  Wt 85.458 kg (188 lb 6.4 oz)  BMI 27.81 kg/m2 General: NAD Neck: JVP 8-9 cm, no thyromegaly or thyroid nodule.  Lungs: Clear to auscultation bilaterally with normal respiratory effort. CV: Nondisplaced PMI.  Heart tachy, irregular S1/S2, no S3/S4, no murmur.  1+ edema 1/2 up lower legs bilaterally.  No carotid bruit.  Normal pedal pulses.  Abdomen: Soft, nontender, no hepatosplenomegaly, no distention.  Neurologic: Alert and oriented x 3.  Psych: Normal affect. Extremities: No clubbing or cyanosis.   Assessment/Plan: 1. Atrial fibrillation: With RVR.  This is a new finding for Mr Weidner.  I suspect that he probably went into atrial fibrillation 2-3 weeks ago and this was the cause of his new symptoms over that period.  He is volume overloaded today with NYHA class III symptoms. He feels badly.  I think that we need to try to get him out of atrial fibrillation sooner rather than later. CHADSVASC = 4.  - Start Xarelto 20 mg daily for CVA prevention.  - Increase atenolol to 50 mg bid for rate control (will decrease amlodipine to 2.5 mg daily with  increase in atenolol).   - Need to check BMET and CBC.   - I will arrange TEE-guided cardioversion after 3 doses of Xarelto.  2. Acute CHF: Patient is volume overloaded on exam with NYHA class III symptoms.  Suspect the onset of atrial fibrillation may have triggered the CHF exacerbation.  - I will assess his EF on TEE later this week.  - Start Lasix 40 mg daily x 2 days then 20 mg daily.  KCl 10 mEq daily.  - BMET today and in 2 wks.  3. CAD: Patient has had atypical chest pain and also has CHF symptoms.  I will arrange for Lexiscan Cardiolite after cardioversion is completed.  He can stop ASA 81 as he will be anticoagulated.  4. Hyperlipidemia: Continue statin.   Meriah Shands 04/30/2013  

## 2013-05-04 NOTE — Procedures (Signed)
Electrical Cardioversion Procedure Note ONEILL BAIS [id number] 11-05-1925  Procedure: Electrical Cardioversion Indications:  Atrial Fibrillation.  Patient has had 4 doses of Xarelto, including this morning.  TEE showed no LAA thrombus.   Procedure Details Consent: Risks of procedure as well as the alternatives and risks of each were explained to the (patient/caregiver).  Consent for procedure obtained. Time Out: Verified patient identification, verified procedure, site/side was marked, verified correct patient position, special equipment/implants available, medications/allergies/relevent history reviewed, required imaging and test results available.  Performed  Patient placed on cardiac monitor, pulse oximetry, supplemental oxygen as necessary.  Sedation given: Propofol per anesthesiology Pacer pads placed anterior and posterior chest.  Cardioverted 2 time(s). The 2nd time sternal pressure was used.  Cardioverted at [place].  Evaluation Findings: Post procedure EKG shows: NSR Complications: None Patient did tolerate procedure well.  Patient converted to NSR but there were frequent PVCs and he took 2 200 J shocks with sternal pressure.  I am concerned that he will not hold NSR without an antiarrhythmic.  I would like to keep him in NSR given difficulty with rate control and possible tachy-mediated cardiomyopathy.  - Continue Toprol XL 50 mg bid. - I will given him amiodaron 150 mg IV now and start him on 200 mg bid at home.   - If he goes back into atrial fibrillation/RVR before discharge will admit him.  - He is going to need close followup.   [name] 05/04/2013, 10:04 AM

## 2013-05-07 ENCOUNTER — Encounter (HOSPITAL_COMMUNITY): Payer: Self-pay | Admitting: Cardiology

## 2013-05-09 ENCOUNTER — Encounter: Payer: Self-pay | Admitting: Cardiology

## 2013-05-09 ENCOUNTER — Ambulatory Visit (INDEPENDENT_AMBULATORY_CARE_PROVIDER_SITE_OTHER): Payer: Medicare Other | Admitting: Cardiology

## 2013-05-09 ENCOUNTER — Encounter: Payer: Self-pay | Admitting: *Deleted

## 2013-05-09 VITALS — BP 120/66 | HR 105 | Ht 69.0 in | Wt 189.0 lb

## 2013-05-09 DIAGNOSIS — I4891 Unspecified atrial fibrillation: Secondary | ICD-10-CM

## 2013-05-09 DIAGNOSIS — I10 Essential (primary) hypertension: Secondary | ICD-10-CM

## 2013-05-09 DIAGNOSIS — I509 Heart failure, unspecified: Secondary | ICD-10-CM

## 2013-05-09 DIAGNOSIS — I2510 Atherosclerotic heart disease of native coronary artery without angina pectoris: Secondary | ICD-10-CM

## 2013-05-09 DIAGNOSIS — E7800 Pure hypercholesterolemia, unspecified: Secondary | ICD-10-CM

## 2013-05-09 LAB — BASIC METABOLIC PANEL
BUN: 20 mg/dL (ref 6–23)
CHLORIDE: 106 meq/L (ref 96–112)
CO2: 28 mEq/L (ref 19–32)
Calcium: 9.4 mg/dL (ref 8.4–10.5)
Creatinine, Ser: 1.9 mg/dL — ABNORMAL HIGH (ref 0.4–1.5)
GFR: 35.49 mL/min — ABNORMAL LOW (ref >60.00)
Glucose, Bld: 80 mg/dL (ref 70–99)
POTASSIUM: 4 meq/L (ref 3.5–5.1)
Sodium: 141 mEq/L (ref 135–145)

## 2013-05-09 LAB — BRAIN NATRIURETIC PEPTIDE: Pro B Natriuretic peptide (BNP): 946 pg/mL — ABNORMAL HIGH (ref 0.0–100.0)

## 2013-05-09 MED ORDER — FUROSEMIDE 40 MG PO TABS: ORAL_TABLET | ORAL | Status: DC

## 2013-05-09 MED ORDER — AMIODARONE HCL 200 MG PO TABS: ORAL_TABLET | ORAL | Status: DC

## 2013-05-09 NOTE — Patient Instructions (Signed)
Increase lasix(furosemide) to 40mg  daily alternating with 20mg  daily. This will be 1 of you 40mg  tablets one day and 1/2 of your 40mg  tablets the next day.  Increase amiodarone to 400mg  two times a day. This will be 2 of your 200mg  tablets two times a day.  Your physician recommends that you have lab work today--BMET/BNP  Your physician has recommended that you have a Cardioversion (DCCV). Electrical Cardioversion uses a jolt of electricity to your heart either through paddles or wired patches attached to your chest. This is a controlled, usually prescheduled, procedure. Defibrillation is done under light anesthesia in the hospital, and you usually go home the day of the procedure. This is done to get your heart back into a normal rhythm. You are not awake for the procedure. Please see the instruction sheet given to you today. Friday May 25, 2013  Your physician recommends that you schedule a follow-up appointment in: about 4 weeks with Dr Aundra Dubin.

## 2013-05-09 NOTE — Progress Notes (Signed)
Patient ID: Christian Gillespie, male   DOB: May 23, 1925, 80 y.o.   MRN: [id number] PCP: Dr. Harrington Challenger  80 yo with history of CAD s/p cath in 1991 showing a totally occluded RCA with no intervention presents today for cardiology followup.  At last visit, he was noted to be in atrial fibrillation with RVR.  He had been feeling "bad" for about 3 wks with exertional dyspnea.  I started him on Xarelto and Lasix and brought him in last week for TEE-guided DCCV.  He converted to NSR but had a lot of PACs after the procedure.  I therefore started him on amiodarone at 200 mg bid.  TEE showed EF 30% with diffuse hypokinesis and moderate MR.   Today, he is back in atrial fibrillation with HR 105.  He felt himself go back into atrial fibrillation he thinks a day or so after the cardioversion.  His breathing is better on Lasix and his HR is better controlled than when I last saw him.  He is still short of breath walking up an incline or a flight of steps.  No orthopnea or PND.  No lightheadedness.  He had 1 episode of chest tightness last night after dinner while brushing his teeth.  Weight is stable.  ECG: Atrial fibrillation at 105 bpm, PVCs  Labs (10/14): creatinine 1.69 Labs (2/15): K 4.5, creatinine 2.2, BNP 692, LDL 75, HDL 35, TSH normal  PMH: 1. HTN 2. Hyperlipidemia 3. CAD: LHC 1991 with totally occluded RCA.  No intervention.  4. Atrial fibrillation: Diagnosed in 2/15.  TEE-guided cardioversion in 2/15 but went back into atrial fibrillation.  5. Cardiomyopathy: ? Tachycardia-mediated.  TEE (2/15) with EF 30%, diffuse hypokinesis, moderate MR, mildly decreased RV systolic function.   SH: Quit smoking in 1987.  Lives in [place].  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   ROS: All systems reviewed and negative except as noted in HPI.   Current Outpatient Prescriptions  Medication Sig Dispense Refill  . amLODipine (NORVASC) 2.5 MG tablet Take 1 tablet (2.5 mg total) by mouth daily.  30  tablet  3  . metoprolol succinate (TOPROL-XL) 50 MG 24 hr tablet Take 1 tablet (50 mg total) by mouth 2 (two) times daily. Take with or immediately following a meal.  60 tablet  3  . Nutritional Supplements (PROSTA VITE PO) Take by mouth daily.      . pravastatin (PRAVACHOL) 80 MG tablet Take 80 mg by mouth daily.        . Rivaroxaban (XARELTO) 15 MG TABS tablet Take 1 tablet (15 mg total) by mouth daily with supper.  30 tablet  3  . amiodarone (PACERONE) 200 MG tablet 2 tablets (400mg ) two times a day  120 tablet  3  . furosemide (LASIX) 40 MG tablet 1 tablet daily (40mg ) alternating with 1/2 tablet (20mg )daily  50 tablet  3   No current facility-administered medications for this visit.    BP 120/66  Pulse 105  Ht 5\' 9"  (1.753 m)  Wt 85.73 kg (189 lb)  BMI 27.90 kg/m2 General: NAD Neck: JVP 8-9 cm, no thyromegaly or thyroid nodule.  Lungs: Clear to auscultation bilaterally with normal respiratory effort. CV: Nondisplaced PMI.  Heart tachy, irregular S1/S2, no S3/S4, 1/6 HSM apex.  1+ edema 1/2 up lower legs bilaterally.  No carotid bruit.  Normal pedal pulses.  Abdomen: Soft, nontender, no hepatosplenomegaly, no distention.  Neurologic: Alert and oriented x 3.  Psych:  Normal affect. Extremities: No clubbing or cyanosis.   Assessment/Plan: 1. Atrial fibrillation: Patient is back in atrial fibrillation after cardioversion.  I am concerned that he may have a tachycardia-mediated cardiomyopathy.  HR 105 => better than at last appointment but still too fast.  - Increase amiodarone to 400 mg bid and will re-attempt cardioversion in 2 wks.  Hopefully it will be more successful after amiodarone loading.  Follow LFTs and TSH with amiodarone.  He will also need yearly eye exam.   - Continue Xarelto 15 mg daily (adjusted for low GFR).    - Continue Toprol XL 50 mg bid.  2. Acute systolic CHF: EF 56% with diffuse hypokinesis on TEE.  Possible tachycardia-mediated cardiomyopathy.  Patient remains  volume overloaded but less symptomatic than at last appointment after starting Lasix.   - Increase Lasix to 40 mg daily alternating with 20 mg daily.   - BMET today and in 2 wks.   - Continue current Toprol XL.  I am not going to start ACEI right now given CKD.  If creatinine remains stable, will eventually try to start at low dose.  3. CAD: Patient has had atypical chest pain and also has CHF symptoms.  I will arrange for Lexiscan Cardiolite.  4. Hyperlipidemia: Continue statin.  5. CKD: Creatinine elevated to 2.2 recently, up from prior 1.69 (10/14).  It is possible that the creatinine rise was hemodynamically mediated with atrial fibrillation/RVR and CHF exacerbation.   [name] 05/09/2013

## 2013-05-10 ENCOUNTER — Ambulatory Visit: Payer: Medicare Other | Admitting: Cardiology

## 2013-05-10 ENCOUNTER — Other Ambulatory Visit: Payer: Medicare Other

## 2013-05-14 ENCOUNTER — Telehealth: Payer: Self-pay | Admitting: Cardiology

## 2013-05-17 ENCOUNTER — Encounter (HOSPITAL_COMMUNITY): Payer: Medicare Other

## 2013-05-21 ENCOUNTER — Encounter: Payer: Self-pay | Admitting: Cardiovascular Disease

## 2013-05-21 ENCOUNTER — Ambulatory Visit (HOSPITAL_COMMUNITY): Payer: Medicare Other | Attending: Cardiovascular Disease | Admitting: Radiology

## 2013-05-21 VITALS — BP 159/72 | Ht 69.0 in | Wt 182.0 lb

## 2013-05-21 DIAGNOSIS — R0602 Shortness of breath: Secondary | ICD-10-CM

## 2013-05-21 DIAGNOSIS — I252 Old myocardial infarction: Secondary | ICD-10-CM | POA: Insufficient documentation

## 2013-05-21 DIAGNOSIS — I4891 Unspecified atrial fibrillation: Secondary | ICD-10-CM

## 2013-05-21 DIAGNOSIS — I10 Essential (primary) hypertension: Secondary | ICD-10-CM | POA: Insufficient documentation

## 2013-05-21 DIAGNOSIS — E785 Hyperlipidemia, unspecified: Secondary | ICD-10-CM | POA: Insufficient documentation

## 2013-05-21 DIAGNOSIS — R0989 Other specified symptoms and signs involving the circulatory and respiratory systems: Secondary | ICD-10-CM | POA: Insufficient documentation

## 2013-05-21 DIAGNOSIS — R079 Chest pain, unspecified: Secondary | ICD-10-CM

## 2013-05-21 DIAGNOSIS — Z87891 Personal history of nicotine dependence: Secondary | ICD-10-CM | POA: Insufficient documentation

## 2013-05-21 DIAGNOSIS — Z01810 Encounter for preprocedural cardiovascular examination: Secondary | ICD-10-CM | POA: Insufficient documentation

## 2013-05-21 DIAGNOSIS — R0609 Other forms of dyspnea: Secondary | ICD-10-CM | POA: Insufficient documentation

## 2013-05-21 MED ORDER — TECHNETIUM TC 99M SESTAMIBI GENERIC - CARDIOLITE
30.0000 | Freq: Once | INTRAVENOUS | Status: AC | PRN
  Administered 2013-05-21: 30 via INTRAVENOUS

## 2013-05-21 MED ORDER — TECHNETIUM TC 99M SESTAMIBI GENERIC - CARDIOLITE
10.0000 | Freq: Once | INTRAVENOUS | Status: AC | PRN
  Administered 2013-05-21: 10 via INTRAVENOUS

## 2013-05-21 MED ORDER — REGADENOSON 0.4 MG/5ML IV SOLN
0.4000 mg | Freq: Once | INTRAVENOUS | Status: AC
  Administered 2013-05-21: 0.4 mg via INTRAVENOUS

## 2013-05-21 NOTE — Progress Notes (Addendum)
Christian Gillespie [address] [place], [place] [address] [phone]    Cardiology Nuclear Med Study  Christian Gillespie is a 80 y.o. male     MRN : [id number]     DOB: May 31, 1925  Procedure Date: 05/21/2013  Nuclear Med Background Indication for Stress Test:  Evaluation for Ischemia, and Cardiac Clearance for Cardioversion on 05-25-2013 by Christian Champagne, MD History:  '91 Catheterization;Approx 20 yrs ago MI per patient;'04/1313 ECHO: EF= 30% and History of Atrial Fib Cardiac Risk Factors: History of Smoking, Hypertension and Lipids  Symptoms:  Chest Pain with Exertion (last date of chest discomfort 2 wks ago) and DOE   Nuclear Pre-Procedure Caffeine/Decaff Intake:  None >12 hrs NPO After: 8:00pm   Lungs:  clear O2 Sat: 98% on room air. IV 0.9% NS with Angio Cath:  22g  IV Site: R Antecubital, tolerated well IV Started by:  Christian Baltimore, RN  Chest Size (in):  44 Cup Size: n/a  Height: 5\' 9"  (1.753 m)  Weight:  182 lb (82.555 kg)  BMI:  Body mass index is 26.86 kg/(m^2). Tech Comments:  Held Toprol this am; no medications this am per patient.    Nuclear Med Study 1 or 2 day study: 1 day  Stress Test Type:  Carlton Adam  Reading MD: N/A  Order Authorizing Provider:  [name], MD  Resting Radionuclide: Technetium 50m Sestamibi  Resting Radionuclide Dose: 11.0 mCi   Stress Radionuclide:  Technetium 54m Sestamibi  Stress Radionuclide Dose: 33.0 mCi           Stress Protocol Rest HR: 54 Stress HR: 61  Rest BP: 159/72 Stress BP: 165/74  Exercise Time (min): n/a METS: n/a   Predicted Max HR: 135 bpm % Max HR: 45.19 bpm Rate Pressure Product: 10065   Dose of Adenosine (mg):  n/a Dose of Lexiscan: 0.4 mg  Dose of Atropine (mg): n/a Dose of Dobutamine: n/a mcg/kg/min (at max HR)  Stress Test Technologist: Christian Haymaker, RN  Nuclear Technologist:  Christian Gillespie, CNMT     Rest Procedure:  Myocardial perfusion imaging was performed at rest 45 minutes  following the intravenous administration of Technetium 86m Sestamibi. Rest ECG: NSR LVH IVCD  Stress Procedure:  The patient received IV Lexiscan 0.4 mg over 15-seconds.  Technetium 84m Sestamibi injected at 30-seconds.  Quantitative spect images were obtained after a 45 minute delay. Stress ECG: No significant change from baseline ECG  QPS Raw Data Images:  Normal; no motion artifact; normal heart/lung ratio. Stress Images:  There is decreased uptake in the inferior wall. Rest Images:  There is decreased uptake in the inferior wall. Subtraction (SDS):  There is a fixed defect that is most consistent with a previous infarction. Transient Ischemic Dilatation (Normal <1.22):  1.02 Lung/Heart Ratio (Normal <0.45):  0.40  Quantitative Gated Spect Images QGS EDV:  209 ml QGS ESV:  123 ml  Impression Exercise Capacity:  Lexiscan with no exercise. BP Response:  Normal blood pressure response. Clinical Symptoms:  Chest and throat tightness with Whoozeyness ECG Impression:  No significant ST segment change suggestive of ischemia. Comparison with Prior Nuclear Study: No images to compare  Overall Impression:  Intermediate risk stress nuclear study Moderate inferior wall infarct from apex to base with minimal periinfarct ischemia involving inferoseptum  Moderate decrese in LV function.  LV Ejection Fraction: 41%.  LV Wall Motion:  Moderate decrease in LV function diffuse worse in the inferior wall   Christian Gillespie  Suspect prior inferior infarct.  Minimal ischemia.  EF 41%.  Given minimal ischemia, would manage atrial fibrillation for now.  Will not do cardiac cath at this time given elevated creatinine.   Christian Gillespie 05/22/2013

## 2013-05-22 ENCOUNTER — Telehealth: Payer: Self-pay | Admitting: *Deleted

## 2013-05-22 NOTE — Telephone Encounter (Signed)
Rescheduled cardioversion for Thursday 05/28/13 at 9 am.  Case # [id number].  Telephone call to patient.  Made aware.  He will arrive at 7 am.

## 2013-05-22 NOTE — Telephone Encounter (Signed)
Notified of stress test results. Will not have to have heart cath now and will try to control his AF. He is scheduled for cardioversion later this week and advised he needs to have that done. Understands and will be there for CV.

## 2013-05-22 NOTE — Telephone Encounter (Signed)
Message copied by Rodman Key on Tue May 22, 2013  9:29 AM ------      Message from: Larey Dresser      Created: Mon May 21, 2013  9:16 PM       This patient is scheduled for cardioversion on Friday when I am off.  Can he be moved to Thursday when I am in the hospital? Thanks. Dalton ------

## 2013-05-23 NOTE — Progress Notes (Signed)
Discussed with patient

## 2013-05-24 ENCOUNTER — Encounter (HOSPITAL_COMMUNITY)
Admission: RE | Disposition: A | Discharge status: Home / Self Care | Payer: Self-pay | Source: Ambulatory Visit | Attending: Cardiology

## 2013-05-24 ENCOUNTER — Telehealth: Payer: Self-pay | Admitting: *Deleted

## 2013-05-24 ENCOUNTER — Ambulatory Visit (HOSPITAL_COMMUNITY)
Admission: RE | Admit: 2013-05-24 | Discharge: 2013-05-24 | Disposition: A | Discharge status: Home / Self Care | Payer: Medicare Other | Source: Ambulatory Visit | Attending: Cardiology | Admitting: Cardiology

## 2013-05-24 ENCOUNTER — Other Ambulatory Visit: Payer: Self-pay

## 2013-05-24 DIAGNOSIS — I10 Essential (primary) hypertension: Secondary | ICD-10-CM

## 2013-05-24 DIAGNOSIS — E7800 Pure hypercholesterolemia, unspecified: Secondary | ICD-10-CM

## 2013-05-24 DIAGNOSIS — I509 Heart failure, unspecified: Secondary | ICD-10-CM

## 2013-05-24 DIAGNOSIS — I4891 Unspecified atrial fibrillation: Secondary | ICD-10-CM

## 2013-05-24 DIAGNOSIS — I498 Other specified cardiac arrhythmias: Secondary | ICD-10-CM | POA: Insufficient documentation

## 2013-05-24 DIAGNOSIS — I2510 Atherosclerotic heart disease of native coronary artery without angina pectoris: Secondary | ICD-10-CM

## 2013-05-24 DIAGNOSIS — Z5309 Procedure and treatment not carried out because of other contraindication: Secondary | ICD-10-CM | POA: Insufficient documentation

## 2013-05-24 SURGERY — CANCELLED PROCEDURE

## 2013-05-24 MED ORDER — AMIODARONE HCL 200 MG PO TABS: ORAL_TABLET | ORAL | Status: DC

## 2013-05-24 NOTE — Telephone Encounter (Signed)
I spoke with pt and (per Dr. Aundra Dubin)  he will decrease his amiodarone to 200mg  bid for 1 week. Then decrease amiodarone to 200mg  daily I have scheduled him an appointment with Dr. Aundra Dubin for 05/29/13 & overbooked him as Dr. Aundra Dubin requested.  Horton Chin RN

## 2013-05-24 NOTE — H&P (View-Only) (Signed)
Patient ID: Christian Gillespie, male   DOB: May 23, 1925, 80 y.o.   MRN: [id number] PCP: Dr. Harrington Challenger  80 yo with history of CAD s/p cath in 1991 showing a totally occluded RCA with no intervention presents today for cardiology followup.  At last visit, he was noted to be in atrial fibrillation with RVR.  He had been feeling "bad" for about 3 wks with exertional dyspnea.  I started him on Xarelto and Lasix and brought him in last week for TEE-guided DCCV.  He converted to NSR but had a lot of PACs after the procedure.  I therefore started him on amiodarone at 200 mg bid.  TEE showed EF 30% with diffuse hypokinesis and moderate MR.   Today, he is back in atrial fibrillation with HR 105.  He felt himself go back into atrial fibrillation he thinks a day or so after the cardioversion.  His breathing is better on Lasix and his HR is better controlled than when I last saw him.  He is still short of breath walking up an incline or a flight of steps.  No orthopnea or PND.  No lightheadedness.  He had 1 episode of chest tightness last night after dinner while brushing his teeth.  Weight is stable.  ECG: Atrial fibrillation at 105 bpm, PVCs  Labs (10/14): creatinine 1.69 Labs (2/15): K 4.5, creatinine 2.2, BNP 692, LDL 75, HDL 35, TSH normal  PMH: 1. HTN 2. Hyperlipidemia 3. CAD: LHC 1991 with totally occluded RCA.  No intervention.  4. Atrial fibrillation: Diagnosed in 2/15.  TEE-guided cardioversion in 2/15 but went back into atrial fibrillation.  5. Cardiomyopathy: ? Tachycardia-mediated.  TEE (2/15) with EF 30%, diffuse hypokinesis, moderate MR, mildly decreased RV systolic function.   SH: Quit smoking in 1987.  Lives in [place].  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   ROS: All systems reviewed and negative except as noted in HPI.   Current Outpatient Prescriptions  Medication Sig Dispense Refill  . amLODipine (NORVASC) 2.5 MG tablet Take 1 tablet (2.5 mg total) by mouth daily.  30  tablet  3  . metoprolol succinate (TOPROL-XL) 50 MG 24 hr tablet Take 1 tablet (50 mg total) by mouth 2 (two) times daily. Take with or immediately following a meal.  60 tablet  3  . Nutritional Supplements (PROSTA VITE PO) Take by mouth daily.      . pravastatin (PRAVACHOL) 80 MG tablet Take 80 mg by mouth daily.        . Rivaroxaban (XARELTO) 15 MG TABS tablet Take 1 tablet (15 mg total) by mouth daily with supper.  30 tablet  3  . amiodarone (PACERONE) 200 MG tablet 2 tablets (400mg ) two times a day  120 tablet  3  . furosemide (LASIX) 40 MG tablet 1 tablet daily (40mg ) alternating with 1/2 tablet (20mg )daily  50 tablet  3   No current facility-administered medications for this visit.    BP 120/66  Pulse 105  Ht 5\' 9"  (1.753 m)  Wt 85.73 kg (189 lb)  BMI 27.90 kg/m2 General: NAD Neck: JVP 8-9 cm, no thyromegaly or thyroid nodule.  Lungs: Clear to auscultation bilaterally with normal respiratory effort. CV: Nondisplaced PMI.  Heart tachy, irregular S1/S2, no S3/S4, 1/6 HSM apex.  1+ edema 1/2 up lower legs bilaterally.  No carotid bruit.  Normal pedal pulses.  Abdomen: Soft, nontender, no hepatosplenomegaly, no distention.  Neurologic: Alert and oriented x 3.  Psych:  Normal affect. Extremities: No clubbing or cyanosis.   Assessment/Plan: 1. Atrial fibrillation: Patient is back in atrial fibrillation after cardioversion.  I am concerned that he may have a tachycardia-mediated cardiomyopathy.  HR 105 => better than at last appointment but still too fast.  - Increase amiodarone to 400 mg bid and will re-attempt cardioversion in 2 wks.  Hopefully it will be more successful after amiodarone loading.  Follow LFTs and TSH with amiodarone.  He will also need yearly eye exam.   - Continue Xarelto 15 mg daily (adjusted for low GFR).    - Continue Toprol XL 50 mg bid.  2. Acute systolic CHF: EF 30% with diffuse hypokinesis on TEE.  Possible tachycardia-mediated cardiomyopathy.  Patient remains  volume overloaded but less symptomatic than at last appointment after starting Lasix.   - Increase Lasix to 40 mg daily alternating with 20 mg daily.   - BMET today and in 2 wks.   - Continue current Toprol XL.  I am not going to start ACEI right now given CKD.  If creatinine remains stable, will eventually try to start at low dose.  3. CAD: Patient has had atypical chest pain and also has CHF symptoms.  I will arrange for Lexiscan Cardiolite.  4. Hyperlipidemia: Continue statin.  5. CKD: Creatinine elevated to 2.2 recently, up from prior 1.69 (10/14).  It is possible that the creatinine rise was hemodynamically mediated with atrial fibrillation/RVR and CHF exacerbation.   [name] 05/09/2013  

## 2013-05-24 NOTE — Interval H&P Note (Signed)
History and Physical Interval Note:  05/24/2013 11:57 AM  Christian Gillespie  has presented today for surgery, with the diagnosis of AFIB  He was in NSR today so was sent home.   Christian Gillespie

## 2013-05-24 NOTE — Progress Notes (Signed)
Patient arrived for DCCV. Patient to be found in Sinus Bradycardia. EKG done. Patient discharged per Dr. Aundra Dubin.

## 2013-05-25 ENCOUNTER — Ambulatory Visit (HOSPITAL_COMMUNITY): Admission: RE | Admit: 2013-05-25 | Payer: Medicare Other | Source: Ambulatory Visit | Admitting: Cardiology

## 2013-05-25 ENCOUNTER — Encounter (HOSPITAL_COMMUNITY): Admission: RE | Payer: Self-pay | Source: Ambulatory Visit

## 2013-05-25 SURGERY — CARDIOVERSION
Anesthesia: Monitor Anesthesia Care

## 2013-05-29 ENCOUNTER — Encounter: Payer: Self-pay | Admitting: Cardiology

## 2013-05-29 ENCOUNTER — Encounter: Payer: Self-pay | Admitting: *Deleted

## 2013-05-29 ENCOUNTER — Ambulatory Visit (INDEPENDENT_AMBULATORY_CARE_PROVIDER_SITE_OTHER): Payer: Medicare Other | Admitting: Cardiology

## 2013-05-29 ENCOUNTER — Ambulatory Visit: Payer: Medicare Other | Admitting: Cardiology

## 2013-05-29 VITALS — BP 132/56 | HR 53 | Ht 69.0 in | Wt 183.0 lb

## 2013-05-29 DIAGNOSIS — I2510 Atherosclerotic heart disease of native coronary artery without angina pectoris: Secondary | ICD-10-CM

## 2013-05-29 DIAGNOSIS — I4891 Unspecified atrial fibrillation: Secondary | ICD-10-CM

## 2013-05-29 DIAGNOSIS — I5042 Chronic combined systolic (congestive) and diastolic (congestive) heart failure: Secondary | ICD-10-CM | POA: Insufficient documentation

## 2013-05-29 DIAGNOSIS — E7800 Pure hypercholesterolemia, unspecified: Secondary | ICD-10-CM

## 2013-05-29 DIAGNOSIS — I5022 Chronic systolic (congestive) heart failure: Secondary | ICD-10-CM

## 2013-05-29 DIAGNOSIS — I509 Heart failure, unspecified: Secondary | ICD-10-CM

## 2013-05-29 MED ORDER — LISINOPRIL 2.5 MG PO TABS: 2.5000 mg | ORAL_TABLET | Freq: Every day | ORAL | Status: DC

## 2013-05-29 NOTE — Progress Notes (Signed)
Patient ID: Christian Gillespie, male   DOB: [birth year], 80 y.o.   MRN: [id number] PCP: Dr. Harrington Challenger  80 yo with history of CAD s/p cath in 1991 showing a totally occluded RCA with no intervention presents today for cardiology followup.  At a prior visit, he was noted to be in atrial fibrillation with RVR.  He had been feeling "bad" for about 3 wks with exertional dyspnea.  I started him on Xarelto and Lasix and brought him in in 2/15 for TEE-guided DCCV.  He converted to NSR but had a lot of PACs after the procedure.  I therefore started him on amiodarone at 200 mg bid.  TEE showed EF 30% with diffuse hypokinesis and moderate MR. At last appointment he was back in atrial fibrillation.  I increased amiodarone, and he went back into NSR without another electrical cardioversion. He is in NSR today.   Breathing is better in NSR.  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  No dyspnea walking on flat ground.  No chest pain.  Weight is down 6 lbs.  Lexiscan Cardiolite in 3/15 showed EF 41% with moderate inferior MI, no significant ischemia.   ECG: NSR at 53, IVCD 130 msec  Labs (10/14): creatinine 1.69 Labs (2/15): K 4.5, creatinine 2.2=>1.9, BNP 692=>946, LDL 75, HDL 35, TSH normal  PMH: 1. HTN 2. Hyperlipidemia 3. CAD: LHC 1991 with totally occluded RCA.  No intervention. Lexiscan Cardiolite in (3/15) showed EF 41%, moderate inferior infarction with minimal peri-infarct ischemia.  4. Atrial fibrillation: Diagnosed in 2/15.  TEE-guided cardioversion in 2/15 but went back into atrial fibrillation.  Amiodarone begun and patient went back into NSR.  5. Cardiomyopathy: ? Tachycardia-mediated.  TEE (2/15) with EF 30%, diffuse hypokinesis, moderate MR, mildly decreased RV systolic function.  EF 41% by Lexiscan Cardiolite in 3/15.   SH: Quit smoking in 1987.  Lives in [place].  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   ROS: All systems reviewed and negative except as noted in  HPI.   Current Outpatient Prescriptions  Medication Sig Dispense Refill  . amiodarone (PACERONE) 200 MG tablet 200mg  twice a day for 1 week. Then 200mg  daily      . amLODipine (NORVASC) 2.5 MG tablet Take 1 tablet (2.5 mg total) by mouth daily.  30 tablet  3  . furosemide (LASIX) 40 MG tablet 1 tablet daily (40mg ) alternating with 1/2 tablet (20mg )daily  50 tablet  3  . metoprolol succinate (TOPROL-XL) 50 MG 24 hr tablet Take 1 tablet (50 mg total) by mouth 2 (two) times daily. Take with or immediately following a meal.  60 tablet  3  . Nutritional Supplements (PROSTA VITE PO) Take by mouth daily.      . pravastatin (PRAVACHOL) 80 MG tablet Take 80 mg by mouth daily.        . Rivaroxaban (XARELTO) 15 MG TABS tablet Take 1 tablet (15 mg total) by mouth daily with supper.  30 tablet  3  . lisinopril (PRINIVIL,ZESTRIL) 2.5 MG tablet Take 1 tablet (2.5 mg total) by mouth daily.  30 tablet  3   No current facility-administered medications for this visit.    BP 132/56  Pulse 53  Ht 5\' 9"  (1.753 m)  Wt 83.008 kg (183 lb)  BMI 27.01 kg/m2 General: NAD Neck:  No JVD, no thyromegaly or thyroid nodule.  Lungs: Clear to auscultation bilaterally with normal respiratory effort. CV: Nondisplaced PMI.  Heart tachy, irregular S1/S2, no S3/S4, 1/6 HSM apex.  1+ edema at ankles bilaterally.  No carotid bruit.  Normal pedal pulses.  Abdomen: Soft, nontender, no hepatosplenomegaly, no distention.  Neurologic: Alert and oriented x 3.  Psych: Normal affect. Extremities: No clubbing or cyanosis.   Assessment/Plan: 1. Atrial fibrillation: Patient is now in NSR on amiodarone.  I am concerned that he may have a tachycardia-mediated cardiomyopathy.  He feels much better symptomatically on Lasix and in NSR.    - Amiodarone to be decreased to 200 mg daily later this week.  Recent TSH was normal.  I will check LFTs with next labs.  He will also need a yearly eye exam while on amiodarone.  - Continue Xarelto 15 mg  daily (adjusted for low GFR).    - Continue Toprol XL 50 mg bid.  2. Acute systolic CHF: EF 71% with diffuse hypokinesis on TEE, EF 41% on Cardiolite.  Possible tachycardia-mediated cardiomyopathy.  Volume status much improved on Lasix.    - Continue current Lasix dosing.   - Continue current Toprol XL.   - Creatinine better.  I will therefore start a low dose of ACEI, lisinopril 2.5 mg daily with BMET in 10 days.  - Repeat echo in about 2 months to see if EF improves back in NSR.  3. CAD: Cardiolite showed prior inferior MI (known from past) without significant ischemia.  No ASA with stable CAD and on Xarelto.  Continue statin.  4. Hyperlipidemia: Continue statin, good lipids recenlty.  5. CKD: Creatinine 1.9 when last checked.   [name] 05/29/2013

## 2013-05-29 NOTE — Patient Instructions (Signed)
Start lisinopril 2.5mg  daily.   Your physician recommends that you return for lab work in: 26 days--BMET/Liver profile  Your physician has requested that you have an echocardiogram. Echocardiography is a painless test that uses sound waves to create images of your heart. It provides your doctor with information about the size and shape of your heart and how well your heart's chambers and valves are working. This procedure takes approximately one hour. There are no restrictions for this procedure. IN 2 MONTHS  Your physician recommends that you schedule a follow-up appointment with Dr Aundra Dubin about 1 week after you have your echo in 2 months.

## 2013-06-11 ENCOUNTER — Other Ambulatory Visit (INDEPENDENT_AMBULATORY_CARE_PROVIDER_SITE_OTHER): Payer: Medicare Other

## 2013-06-11 ENCOUNTER — Ambulatory Visit: Payer: Medicare Other | Admitting: Cardiology

## 2013-06-11 DIAGNOSIS — I4891 Unspecified atrial fibrillation: Secondary | ICD-10-CM

## 2013-06-11 DIAGNOSIS — I509 Heart failure, unspecified: Secondary | ICD-10-CM

## 2013-06-11 LAB — BASIC METABOLIC PANEL
BUN: 32 mg/dL — ABNORMAL HIGH (ref 6–23)
CALCIUM: 9.4 mg/dL (ref 8.4–10.5)
CO2: 28 meq/L (ref 19–32)
CREATININE: 2.7 mg/dL — AB (ref 0.4–1.5)
Chloride: 104 mEq/L (ref 96–112)
GFR: 23.64 mL/min — ABNORMAL LOW (ref >60.00)
Glucose, Bld: 78 mg/dL (ref 70–99)
POTASSIUM: 3.8 meq/L (ref 3.5–5.1)
SODIUM: 141 meq/L (ref 135–145)

## 2013-06-11 LAB — HEPATIC FUNCTION PANEL
ALBUMIN: 4.2 g/dL (ref 3.5–5.2)
ALT: 17 U/L (ref 0–53)
AST: 22 U/L (ref 0–37)
Alkaline Phosphatase: 45 U/L (ref 39–117)
Bilirubin, Direct: 0.2 mg/dL (ref 0.0–0.3)
Total Bilirubin: 0.9 mg/dL (ref 0.3–1.2)
Total Protein: 6.9 g/dL (ref 6.0–8.3)

## 2013-06-12 ENCOUNTER — Other Ambulatory Visit: Payer: Self-pay | Admitting: *Deleted

## 2013-06-12 DIAGNOSIS — I4891 Unspecified atrial fibrillation: Secondary | ICD-10-CM

## 2013-06-12 DIAGNOSIS — I509 Heart failure, unspecified: Secondary | ICD-10-CM

## 2013-06-12 DIAGNOSIS — I2510 Atherosclerotic heart disease of native coronary artery without angina pectoris: Secondary | ICD-10-CM

## 2013-06-12 DIAGNOSIS — I10 Essential (primary) hypertension: Secondary | ICD-10-CM

## 2013-06-12 DIAGNOSIS — E7800 Pure hypercholesterolemia, unspecified: Secondary | ICD-10-CM

## 2013-06-15 ENCOUNTER — Other Ambulatory Visit: Payer: Self-pay

## 2013-06-15 DIAGNOSIS — I4891 Unspecified atrial fibrillation: Secondary | ICD-10-CM

## 2013-06-15 DIAGNOSIS — I10 Essential (primary) hypertension: Secondary | ICD-10-CM

## 2013-06-15 DIAGNOSIS — E7800 Pure hypercholesterolemia, unspecified: Secondary | ICD-10-CM

## 2013-06-15 DIAGNOSIS — I509 Heart failure, unspecified: Secondary | ICD-10-CM

## 2013-06-15 DIAGNOSIS — I2510 Atherosclerotic heart disease of native coronary artery without angina pectoris: Secondary | ICD-10-CM

## 2013-06-15 MED ORDER — METOPROLOL SUCCINATE ER 50 MG PO TB24: 50.0000 mg | ORAL_TABLET | Freq: Two times a day (BID) | ORAL | Status: DC

## 2013-06-15 MED ORDER — FUROSEMIDE 40 MG PO TABS: ORAL_TABLET | ORAL | Status: DC

## 2013-06-15 MED ORDER — AMIODARONE HCL 200 MG PO TABS: 200.0000 mg | ORAL_TABLET | Freq: Every day | ORAL | Status: DC

## 2013-06-15 MED ORDER — AMIODARONE HCL 200 MG PO TABS: ORAL_TABLET | ORAL | Status: DC

## 2013-06-15 MED ORDER — AMLODIPINE BESYLATE 2.5 MG PO TABS: 2.5000 mg | ORAL_TABLET | Freq: Every day | ORAL | Status: DC

## 2013-06-19 ENCOUNTER — Other Ambulatory Visit (INDEPENDENT_AMBULATORY_CARE_PROVIDER_SITE_OTHER): Payer: Medicare Other

## 2013-06-19 DIAGNOSIS — I4891 Unspecified atrial fibrillation: Secondary | ICD-10-CM

## 2013-06-19 DIAGNOSIS — I10 Essential (primary) hypertension: Secondary | ICD-10-CM

## 2013-06-19 DIAGNOSIS — E7800 Pure hypercholesterolemia, unspecified: Secondary | ICD-10-CM

## 2013-06-19 DIAGNOSIS — I509 Heart failure, unspecified: Secondary | ICD-10-CM

## 2013-06-19 DIAGNOSIS — I2510 Atherosclerotic heart disease of native coronary artery without angina pectoris: Secondary | ICD-10-CM

## 2013-06-19 LAB — BASIC METABOLIC PANEL
BUN: 33 mg/dL — ABNORMAL HIGH (ref 6–23)
CHLORIDE: 105 meq/L (ref 96–112)
CO2: 28 mEq/L (ref 19–32)
Calcium: 9.2 mg/dL (ref 8.4–10.5)
Creatinine, Ser: 2.4 mg/dL — ABNORMAL HIGH (ref 0.4–1.5)
GFR: 27.56 mL/min — ABNORMAL LOW (ref >60.00)
Glucose, Bld: 98 mg/dL (ref 70–99)
Potassium: 3.6 mEq/L (ref 3.5–5.1)
SODIUM: 141 meq/L (ref 135–145)

## 2013-07-02 ENCOUNTER — Ambulatory Visit: Payer: Medicare Other | Admitting: Cardiology

## 2013-07-30 ENCOUNTER — Ambulatory Visit (HOSPITAL_COMMUNITY): Payer: Medicare Other | Attending: Cardiology | Admitting: Cardiology

## 2013-07-30 ENCOUNTER — Encounter: Payer: Self-pay | Admitting: Cardiology

## 2013-07-30 ENCOUNTER — Telehealth: Payer: Self-pay | Admitting: Cardiology

## 2013-07-30 DIAGNOSIS — I2510 Atherosclerotic heart disease of native coronary artery without angina pectoris: Secondary | ICD-10-CM

## 2013-07-30 DIAGNOSIS — I5022 Chronic systolic (congestive) heart failure: Secondary | ICD-10-CM

## 2013-07-30 DIAGNOSIS — I509 Heart failure, unspecified: Secondary | ICD-10-CM

## 2013-07-30 DIAGNOSIS — I10 Essential (primary) hypertension: Secondary | ICD-10-CM | POA: Insufficient documentation

## 2013-07-30 DIAGNOSIS — E785 Hyperlipidemia, unspecified: Secondary | ICD-10-CM | POA: Insufficient documentation

## 2013-07-30 DIAGNOSIS — I4891 Unspecified atrial fibrillation: Secondary | ICD-10-CM

## 2013-07-30 NOTE — Telephone Encounter (Signed)
Note created in error.

## 2013-07-30 NOTE — Progress Notes (Signed)
Limited echo performed. 

## 2013-07-30 NOTE — Progress Notes (Signed)
Patient ID: Christian Gillespie, male   DOB: 13-May-1925, 80 y.o.   MRN: [id number] Patient reports blurring in his left eye.  No change on the right.  He is anticoagulated with Xarelto for paroxysmal atrial fibrillation.  He is not lightheaded.  He is also on amiodarone.  He needs to see an ophthalmologist for evaluation.  As amiodarone can cause deposits in the eye, I will have him hold amiodarone until he sees the eye doctor.  He can resume afterwards if no evidence for amiodarone toxicity.  Symptoms are not new, have been present for 2 weeks.  The blurring is relatively mild but present.  Don't sound like central retinal artery occlusion from cardioembolism as vision is still fairly good from the left eye.   Larey Dresser 07/30/2013

## 2013-07-30 NOTE — Telephone Encounter (Deleted)
Pt left message with front desk while in the office for [facility] stating that he feels one of his medications may be affecting his eyesight. Per Dr. Aundra Dubin pt should hold amiodarone, see eye doctor, and then restart amiodarone if okay with eye doctor.

## 2013-07-30 NOTE — Telephone Encounter (Deleted)
Pt notified by Dr. Aundra Dubin.

## 2013-07-31 ENCOUNTER — Telehealth: Payer: Self-pay

## 2013-07-31 NOTE — Telephone Encounter (Signed)
Dr Aundra Dubin received a phone call from Dr Crissie Figures (optometrist).  Based on this conversation the pt should restart Amiodarone and continue to hold Xarelto until further evaluation with Dr Zigmund Daniel. I have left a message on the pt's home phone to contact our office for medication instructions.

## 2013-08-01 NOTE — Telephone Encounter (Signed)
Follow up  ° ° °Patient returning call back to nurse  °

## 2013-08-01 NOTE — Telephone Encounter (Signed)
I spoke with the pt and made him aware of Dr Claris Gladden recommendations.  The pt is scheduled to see Dr West Pugh on Friday at 12:30.  He will have Dr West Pugh contact Dr Aundra Dubin after his evaluation.

## 2013-08-03 ENCOUNTER — Encounter (INDEPENDENT_AMBULATORY_CARE_PROVIDER_SITE_OTHER): Payer: Medicare Other | Admitting: Ophthalmology

## 2013-08-03 DIAGNOSIS — H353290 Exudative age-related macular degeneration, unspecified eye, stage unspecified: Secondary | ICD-10-CM

## 2013-08-03 DIAGNOSIS — H3530 Unspecified macular degeneration: Secondary | ICD-10-CM

## 2013-08-03 DIAGNOSIS — I10 Essential (primary) hypertension: Secondary | ICD-10-CM

## 2013-08-03 DIAGNOSIS — H35039 Hypertensive retinopathy, unspecified eye: Secondary | ICD-10-CM

## 2013-08-03 DIAGNOSIS — H43819 Vitreous degeneration, unspecified eye: Secondary | ICD-10-CM

## 2013-08-09 ENCOUNTER — Ambulatory Visit (INDEPENDENT_AMBULATORY_CARE_PROVIDER_SITE_OTHER): Payer: Medicare Other | Admitting: Cardiology

## 2013-08-09 ENCOUNTER — Encounter: Payer: Self-pay | Admitting: Cardiology

## 2013-08-09 VITALS — BP 160/47 | HR 43 | Ht 69.0 in | Wt 180.0 lb

## 2013-08-09 DIAGNOSIS — I10 Essential (primary) hypertension: Secondary | ICD-10-CM

## 2013-08-09 DIAGNOSIS — E7800 Pure hypercholesterolemia, unspecified: Secondary | ICD-10-CM

## 2013-08-09 DIAGNOSIS — I2510 Atherosclerotic heart disease of native coronary artery without angina pectoris: Secondary | ICD-10-CM

## 2013-08-09 DIAGNOSIS — I4891 Unspecified atrial fibrillation: Secondary | ICD-10-CM

## 2013-08-09 DIAGNOSIS — I509 Heart failure, unspecified: Secondary | ICD-10-CM

## 2013-08-09 DIAGNOSIS — I5022 Chronic systolic (congestive) heart failure: Secondary | ICD-10-CM

## 2013-08-09 LAB — HEPATIC FUNCTION PANEL
ALT: 21 U/L (ref 0–53)
AST: 26 U/L (ref 0–37)
Albumin: 3.8 g/dL (ref 3.5–5.2)
Alkaline Phosphatase: 47 U/L (ref 39–117)
BILIRUBIN TOTAL: 0.6 mg/dL (ref 0.2–1.2)
Bilirubin, Direct: 0 mg/dL (ref 0.0–0.3)
TOTAL PROTEIN: 6.9 g/dL (ref 6.0–8.3)

## 2013-08-09 LAB — BASIC METABOLIC PANEL
BUN: 25 mg/dL — ABNORMAL HIGH (ref 6–23)
CALCIUM: 9.3 mg/dL (ref 8.4–10.5)
CO2: 30 mEq/L (ref 19–32)
CREATININE: 2.2 mg/dL — AB (ref 0.4–1.5)
Chloride: 106 mEq/L (ref 96–112)
GFR: 30.79 mL/min — AB (ref >60.00)
Glucose, Bld: 78 mg/dL (ref 70–99)
Potassium: 4.5 mEq/L (ref 3.5–5.1)
Sodium: 142 mEq/L (ref 135–145)

## 2013-08-09 LAB — TSH: TSH: 1.69 u[IU]/mL (ref 0.35–4.50)

## 2013-08-09 MED ORDER — METOPROLOL SUCCINATE ER 25 MG PO TB24: 25.0000 mg | ORAL_TABLET | Freq: Two times a day (BID) | ORAL | Status: DC

## 2013-08-09 MED ORDER — AMLODIPINE BESYLATE 5 MG PO TABS: 5.0000 mg | ORAL_TABLET | Freq: Every day | ORAL | Status: AC

## 2013-08-09 NOTE — Patient Instructions (Signed)
Decrease Toprol Xl to 25mg  two times a day. You can take 1/2 of your 50mg  tablet two times a day and use your current supply.   Increase amlodipine to 5mg  daily. You can take 2 of your 2.5mg  tablets daily at the same time and use your current supply.  Your physician recommends that you have  lab work today--BMET/TSH/Liver profile.  Your physician recommends that you schedule a follow-up appointment in: 3 months with Dr Aundra Dubin.

## 2013-08-10 NOTE — Progress Notes (Signed)
Patient ID: Christian Gillespie, male   DOB: [birth year], 80 y.o.   MRN: [id number] PCP: Dr. Harrington Challenger  80 yo with history of CAD s/p cath in 1991 showing a totally occluded RCA with no intervention presents today for cardiology followup.  In 2/15, he was noted to be in atrial fibrillation with RVR.  He had been feeling "bad" for about 3 wks with exertional dyspnea.  I started him on Xarelto and Lasix and brought him in later in 2/15 for TEE-guided DCCV.  He converted to NSR but had a lot of PACs after the procedure.  I therefore started him on amiodarone at 200 mg bid.  TEE showed EF 30% with diffuse hypokinesis and moderate MR. At a subsequent appointment he was back in atrial fibrillation.  I increased amiodarone, and he went back into NSR without another electrical cardioversion. He is in NSR today. Lexiscan Cardiolite in 3/15 showed EF 41% with moderate inferior MI, no significant ischemia.  Repeat echo in 5/15 showed EF 50-55% with hypokinesis of the basal to mid inferolateral and inferior walls.   Breathing is better in NSR.  He is now preaching again on Sundays.  No dyspnea walking on flat ground, and he can climb a flight of steps.  No chest pain.  Weight is down 3 lbs.  He is off lisinopril now with elevated creatinine.  He was recently found to have macular degeneration with retinal hemorrhage.   ECG: NSR at 43, left axis deviation, LVH  Labs (10/14): creatinine 1.69 Labs (2/15): K 4.5, creatinine 2.2=>1.9, BNP 692=>946, LDL 75, HDL 35, TSH normal Labs (3/15): K 3.6, creatinine 2.4  PMH: 1. HTN 2. Hyperlipidemia 3. CAD: LHC 1991 with totally occluded RCA.  No intervention. Lexiscan Cardiolite in (3/15) showed EF 41%, moderate inferior infarction with minimal peri-infarct ischemia.  4. Atrial fibrillation: Diagnosed in 2/15.  TEE-guided cardioversion in 2/15 but went back into atrial fibrillation.  Amiodarone begun and patient went back into NSR.  5. 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  TEE  (2/15) with EF 30%, diffuse hypokinesis, moderate MR, mildly decreased RV systolic function.  EF 41% by Lexiscan Cardiolite in 3/15.  Echo (5/15) with EF 50-55%, hypokinesis of the basal to mid inferolateral and inferior walls.  6. Macular degeneration  SH: Quit smoking in 1987.  Lives in [place].  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   ROS: All systems reviewed and negative except as noted in HPI.   Current Outpatient Prescriptions  Medication Sig Dispense Refill  . amiodarone (PACERONE) 200 MG tablet Take 200 mg by mouth daily.      . furosemide (LASIX) 40 MG tablet 1/2 tablet (20mg )daily  90 tablet  3  . pravastatin (PRAVACHOL) 80 MG tablet Take 80 mg by mouth daily.        . Rivaroxaban (XARELTO) 15 MG TABS tablet Take 1 tablet (15 mg total) by mouth daily with supper.  30 tablet  3  . Saw Palmetto, Serenoa repens, (SAW PALMETTO PO) Take by mouth daily.      Marland Kitchen amLODipine (NORVASC) 5 MG tablet Take 1 tablet (5 mg total) by mouth daily.  30 tablet  6  . metoprolol succinate (TOPROL XL) 25 MG 24 hr tablet Take 1 tablet (25 mg total) by mouth 2 (two) times daily.  60 tablet  6   No current facility-administered medications for this visit.    BP 160/47  Pulse 43  Ht 5\' 9"  (1.753 m)  Wt 180 lb (81.647 kg)  BMI 26.57 kg/m2 General: NAD Neck:  No JVD, no thyromegaly or thyroid nodule.  Lungs: Clear to auscultation bilaterally with normal respiratory effort. CV: Nondisplaced PMI.  Heart tachy, irregular S1/S2, no S3/S4, 1/6 HSM apex.  No edema.  No carotid bruit.  Normal pedal pulses.  Abdomen: Soft, nontender, no hepatosplenomegaly, no distention.  Neurologic: Alert and oriented x 3.  Psych: Normal affect. Extremities: No clubbing or cyanosis.   Assessment/Plan: 1. Atrial fibrillation: Patient is now in NSR on amiodarone.   - Continue amiodarone 200 mg daily.  He needs LFTs and TSH.  He has regular eye evaluation.  - Continue Xarelto 15 mg daily (adjusted  for low GFR).    - Decrease Toprol XL to 25 mg bid with bradycardia.   2. Chronic systolic CHF: EF 79% with diffuse hypokinesis on TEE in 2/15, EF 41% on Cardiolite in 3/15. After being in NSR for a couple of months, EF was 50-55% by echo in 5/15. 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  Symptomatically doing better, NYHA class II.  He is not volume overloaded on exam.  - Continue current Lasix dosing for now, but will repeat BMET and may cut back on Lasix if creatinine remains elevated.   - No ACEI with elevated creatinine.  Toprol XL to be decreased with bradycardia. 3. CAD: Cardiolite showed prior inferior MI (known from past) without significant ischemia.  No ASA with stable CAD and on Xarelto.  Continue statin.  4. Hyperlipidemia: Continue statin, good lipids when last checked.  5. CKD: Creatinine 2.4 in 3/15, will repeat BMET.  6. HTN: BP is elevated and I am decreasing Toprol XL.  I will increase amlodipine to 5 mg daily.    Followup in 3 months.    Larey Dresser 08/10/2013

## 2013-08-30 ENCOUNTER — Encounter (INDEPENDENT_AMBULATORY_CARE_PROVIDER_SITE_OTHER): Payer: Medicare Other | Admitting: Ophthalmology

## 2013-08-30 DIAGNOSIS — H353290 Exudative age-related macular degeneration, unspecified eye, stage unspecified: Secondary | ICD-10-CM

## 2013-08-30 DIAGNOSIS — H43819 Vitreous degeneration, unspecified eye: Secondary | ICD-10-CM

## 2013-08-30 DIAGNOSIS — H35039 Hypertensive retinopathy, unspecified eye: Secondary | ICD-10-CM

## 2013-08-30 DIAGNOSIS — I10 Essential (primary) hypertension: Secondary | ICD-10-CM

## 2013-08-30 DIAGNOSIS — H3530 Unspecified macular degeneration: Secondary | ICD-10-CM

## 2013-09-03 ENCOUNTER — Other Ambulatory Visit: Payer: Self-pay

## 2013-09-03 DIAGNOSIS — I4891 Unspecified atrial fibrillation: Secondary | ICD-10-CM

## 2013-09-03 MED ORDER — RIVAROXABAN 15 MG PO TABS: 15.0000 mg | ORAL_TABLET | Freq: Every day | ORAL | Status: DC

## 2013-09-27 ENCOUNTER — Encounter (INDEPENDENT_AMBULATORY_CARE_PROVIDER_SITE_OTHER): Payer: Medicare Other | Admitting: Ophthalmology

## 2013-09-27 DIAGNOSIS — H3530 Unspecified macular degeneration: Secondary | ICD-10-CM

## 2013-09-27 DIAGNOSIS — H35039 Hypertensive retinopathy, unspecified eye: Secondary | ICD-10-CM

## 2013-09-27 DIAGNOSIS — I10 Essential (primary) hypertension: Secondary | ICD-10-CM

## 2013-09-27 DIAGNOSIS — H353290 Exudative age-related macular degeneration, unspecified eye, stage unspecified: Secondary | ICD-10-CM

## 2013-09-27 DIAGNOSIS — H43819 Vitreous degeneration, unspecified eye: Secondary | ICD-10-CM

## 2013-10-24 ENCOUNTER — Encounter (INDEPENDENT_AMBULATORY_CARE_PROVIDER_SITE_OTHER): Payer: Medicare Other | Admitting: Ophthalmology

## 2013-10-25 ENCOUNTER — Encounter (INDEPENDENT_AMBULATORY_CARE_PROVIDER_SITE_OTHER): Payer: Medicare Other | Admitting: Ophthalmology

## 2013-10-25 DIAGNOSIS — H43819 Vitreous degeneration, unspecified eye: Secondary | ICD-10-CM

## 2013-10-25 DIAGNOSIS — H3530 Unspecified macular degeneration: Secondary | ICD-10-CM

## 2013-10-25 DIAGNOSIS — H35039 Hypertensive retinopathy, unspecified eye: Secondary | ICD-10-CM

## 2013-10-25 DIAGNOSIS — I10 Essential (primary) hypertension: Secondary | ICD-10-CM

## 2013-10-25 DIAGNOSIS — H353290 Exudative age-related macular degeneration, unspecified eye, stage unspecified: Secondary | ICD-10-CM

## 2013-11-22 ENCOUNTER — Encounter (INDEPENDENT_AMBULATORY_CARE_PROVIDER_SITE_OTHER): Payer: Medicare Other | Admitting: Ophthalmology

## 2013-11-22 DIAGNOSIS — I10 Essential (primary) hypertension: Secondary | ICD-10-CM

## 2013-11-22 DIAGNOSIS — H43819 Vitreous degeneration, unspecified eye: Secondary | ICD-10-CM

## 2013-11-22 DIAGNOSIS — H3530 Unspecified macular degeneration: Secondary | ICD-10-CM

## 2013-11-22 DIAGNOSIS — H353290 Exudative age-related macular degeneration, unspecified eye, stage unspecified: Secondary | ICD-10-CM

## 2013-11-22 DIAGNOSIS — H35039 Hypertensive retinopathy, unspecified eye: Secondary | ICD-10-CM

## 2013-11-23 ENCOUNTER — Encounter: Payer: Self-pay | Admitting: Cardiology

## 2013-11-23 ENCOUNTER — Ambulatory Visit (INDEPENDENT_AMBULATORY_CARE_PROVIDER_SITE_OTHER): Payer: Medicare Other | Admitting: Cardiology

## 2013-11-23 VITALS — BP 122/44 | HR 44 | Ht 69.0 in | Wt 178.8 lb

## 2013-11-23 DIAGNOSIS — I4891 Unspecified atrial fibrillation: Secondary | ICD-10-CM

## 2013-11-23 DIAGNOSIS — I2510 Atherosclerotic heart disease of native coronary artery without angina pectoris: Secondary | ICD-10-CM

## 2013-11-23 DIAGNOSIS — I509 Heart failure, unspecified: Secondary | ICD-10-CM

## 2013-11-23 DIAGNOSIS — I5022 Chronic systolic (congestive) heart failure: Secondary | ICD-10-CM

## 2013-11-23 DIAGNOSIS — I10 Essential (primary) hypertension: Secondary | ICD-10-CM

## 2013-11-23 DIAGNOSIS — I480 Paroxysmal atrial fibrillation: Secondary | ICD-10-CM

## 2013-11-23 DIAGNOSIS — E7800 Pure hypercholesterolemia, unspecified: Secondary | ICD-10-CM

## 2013-11-23 MED ORDER — AMIODARONE HCL 200 MG PO TABS
100.0000 mg | ORAL_TABLET | Freq: Every day | ORAL | Status: DC
Start: 2013-11-23 — End: 2014-04-08

## 2013-11-23 MED ORDER — METOPROLOL SUCCINATE ER 25 MG PO TB24: 12.5000 mg | ORAL_TABLET | Freq: Every day | ORAL | Status: AC

## 2013-11-23 NOTE — Patient Instructions (Signed)
Your physician has recommended you make the following change in your medication:   1. Decrease Metoprolol 25 mg to 1/2 tablet daily.   2. Decrease Amiodarone 200 mg to 1/2 tablet daily.  Your physician recommends that you return for lab work today for tsh, bmet, cbc and hepatic.  Your physician recommends that you schedule a follow-up appointment in: 3 months with Dr. Aundra Dubin.

## 2013-11-24 LAB — CBC
HCT: 32.2 % — ABNORMAL LOW (ref 39.0–52.0)
Hemoglobin: 10.7 g/dL — ABNORMAL LOW (ref 13.0–17.0)
MCH: 31.8 pg (ref 26.0–34.0)
MCHC: 33.2 g/dL (ref 30.0–36.0)
MCV: 95.5 fL (ref 78.0–100.0)
Platelets: 168 10*3/uL (ref 150–400)
RBC: 3.37 MIL/uL — AB (ref 4.22–5.81)
RDW: 13.6 % (ref 11.5–15.5)
WBC: 3.8 10*3/uL — AB (ref 4.0–10.5)

## 2013-11-24 LAB — HEPATIC FUNCTION PANEL
ALK PHOS: 59 U/L (ref 39–117)
ALT: 17 U/L (ref 0–53)
AST: 22 U/L (ref 0–37)
Albumin: 4.2 g/dL (ref 3.5–5.2)
BILIRUBIN DIRECT: 0.2 mg/dL (ref 0.0–0.3)
Indirect Bilirubin: 0.4 mg/dL (ref 0.2–1.2)
Total Bilirubin: 0.6 mg/dL (ref 0.2–1.2)
Total Protein: 6.7 g/dL (ref 6.0–8.3)

## 2013-11-24 LAB — TSH: TSH: 3.631 u[IU]/mL (ref 0.350–4.500)

## 2013-11-24 LAB — BASIC METABOLIC PANEL
BUN: 28 mg/dL — ABNORMAL HIGH (ref 6–23)
CO2: 28 mEq/L (ref 19–32)
CREATININE: 2.56 mg/dL — AB (ref 0.50–1.35)
Calcium: 9.1 mg/dL (ref 8.4–10.5)
Chloride: 105 mEq/L (ref 96–112)
Glucose, Bld: 94 mg/dL (ref 70–99)
Potassium: 4.6 mEq/L (ref 3.5–5.3)
Sodium: 142 mEq/L (ref 135–145)

## 2013-11-25 NOTE — Progress Notes (Signed)
Patient ID: Christian Gillespie, male   DOB: 1926/02/25, 80 y.o.   MRN: [id number] PCP: Dr. Harrington Challenger  80 yo with history of CAD s/p cath in 1991 showing a totally occluded RCA with no intervention presents today for cardiology followup.  In 2/15, he was noted to be in atrial fibrillation with RVR.  He had been feeling "bad" for about 3 wks with exertional dyspnea.  I started him on Xarelto and Lasix and brought him in later in 2/15 for TEE-guided DCCV.  He converted to NSR but had a lot of PACs after the procedure.  I therefore started him on amiodarone at 200 mg bid.  TEE showed EF 30% with diffuse hypokinesis and moderate Christian. At a subsequent appointment he was back in atrial fibrillation.  I increased amiodarone, and he went back into NSR without another electrical cardioversion. He is in NSR today. Lexiscan Cardiolite in 3/15 showed EF 41% with moderate inferior MI, no significant ischemia.  Repeat echo in 5/15 showed EF 50-55% with hypokinesis of the basal to mid inferolateral and inferior walls.   Christian Gillespie remains in NSR but is bradycardic.  No dyspnea walking on flat ground, and he can climb a flight of steps.  No chest pain.  Weight is down 2 lbs.    ECG: NSR at 44, IVCD  Labs (10/14): creatinine 1.69 Labs (2/15): K 4.5, creatinine 2.2=>1.9, BNP 692=>946, LDL 75, HDL 35, TSH normal Labs (3/15): K 3.6, creatinine 2.4 Labs (5/15): K 4.5, creatinine 2.2, LFTs normal, TSH normal  PMH: 1. HTN 2. Hyperlipidemia 3. CAD: LHC 1991 with totally occluded RCA.  No intervention. Lexiscan Cardiolite in (3/15) showed EF 41%, moderate inferior infarction with minimal peri-infarct ischemia.  4. Atrial fibrillation: Diagnosed in 2/15.  TEE-guided cardioversion in 2/15 but went back into atrial fibrillation.  Amiodarone begun and patient went back into NSR.  5. 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  TEE (2/15) with EF 30%, diffuse hypokinesis, moderate Christian, mildly decreased RV systolic function.  EF 41% by  Lexiscan Cardiolite in 3/15.  Echo (5/15) with EF 50-55%, hypokinesis of the basal to mid inferolateral and inferior walls.  6. Macular degeneration 7. Sinus bradycardia  SH: Quit smoking in 1987.  Lives in [place].  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   ROS: All systems reviewed and negative except as noted in HPI.   Current Outpatient Prescriptions  Medication Sig Dispense Refill  . amiodarone (PACERONE) 200 MG tablet Take 0.5 tablets (100 mg total) by mouth daily.      Marland Kitchen amLODipine (NORVASC) 5 MG tablet Take 1 tablet (5 mg total) by mouth daily.  30 tablet  6  . furosemide (LASIX) 40 MG tablet 1/2 tablet (20mg )daily  90 tablet  3  . metoprolol succinate (TOPROL-XL) 25 MG 24 hr tablet Take 0.5 tablets (12.5 mg total) by mouth daily.      . Nutritional Supplements (PROSTA VITE PO) Take 1 tablet by mouth daily.      . pravastatin (PRAVACHOL) 80 MG tablet Take 80 mg by mouth daily.        . Rivaroxaban (XARELTO) 15 MG TABS tablet Take 1 tablet (15 mg total) by mouth daily with supper.  30 tablet  6   No current facility-administered medications for this visit.    BP 122/44  Pulse 44  Ht 5\' 9"  (1.753 m)  Wt 178 lb 12.8 oz (81.103 kg)  BMI 26.39 kg/m2 General: NAD Neck:  No JVD,  no thyromegaly or thyroid nodule.  Lungs: Clear to auscultation bilaterally with normal respiratory effort. CV: Nondisplaced PMI.  Heart brady, regular S1/S2, no S3/S4, 1/6 HSM apex.  1+ ankle edema.  No carotid bruit.  Normal pedal pulses.  Abdomen: Soft, nontender, no hepatosplenomegaly, no distention.  Neurologic: Alert and oriented x 3.  Psych: Normal affect. Extremities: No clubbing or cyanosis.   Assessment/Plan: 1. Atrial fibrillation: Patient is now in NSR on amiodarone.   - Continue amiodarone but with bradycardia will decrease to 100 mg daily.  He needs LFTs and TSH today.  He has regular eye evaluation.  - Continue Xarelto 15 mg daily (adjusted for low GFR), check  CBC.    - Decrease Toprol XL to 12.5 mg daily with bradycardia.   2. Chronic systolic CHF: EF 61% with diffuse hypokinesis on TEE in 2/15, EF 41% on Cardiolite in 3/15. After being in NSR for a couple of months, EF was 50-55% by echo in 5/15. 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  Symptomatically doing better, NYHA class II.  He is not volume overloaded on exam.  - Continue current Lasix dosing for now, BMET today.   - No ACEI with elevated creatinine.  Toprol XL to be decreased with bradycardia. 3. CAD: Cardiolite showed prior inferior MI (known from past) without significant ischemia.  No ASA with stable CAD and on Xarelto.  Continue statin.  4. Hyperlipidemia: Continue statin, good lipids when last checked.  5. CKD: Creatinine 2.2 in 5/15, repeat BMET today.   6. HTN: BP not elevated.   Followup in 3 months.    [name] 11/25/2013

## 2013-11-28 ENCOUNTER — Other Ambulatory Visit: Payer: Self-pay | Admitting: *Deleted

## 2013-11-28 DIAGNOSIS — E7800 Pure hypercholesterolemia, unspecified: Secondary | ICD-10-CM

## 2013-12-20 ENCOUNTER — Encounter (INDEPENDENT_AMBULATORY_CARE_PROVIDER_SITE_OTHER): Payer: Medicare Other | Admitting: Ophthalmology

## 2013-12-20 DIAGNOSIS — H43813 Vitreous degeneration, bilateral: Secondary | ICD-10-CM

## 2013-12-20 DIAGNOSIS — H35033 Hypertensive retinopathy, bilateral: Secondary | ICD-10-CM

## 2013-12-20 DIAGNOSIS — I10 Essential (primary) hypertension: Secondary | ICD-10-CM

## 2013-12-20 DIAGNOSIS — H3531 Nonexudative age-related macular degeneration: Secondary | ICD-10-CM

## 2013-12-20 DIAGNOSIS — H3532 Exudative age-related macular degeneration: Secondary | ICD-10-CM

## 2014-01-08 ENCOUNTER — Telehealth: Payer: Self-pay | Admitting: Cardiology

## 2014-01-08 NOTE — Telephone Encounter (Signed)
Pt requesting samples of xarelto because he is in the doughnut hole. Pt advised samples left at front desk for pt to pick up.

## 2014-01-08 NOTE — Telephone Encounter (Signed)
New problem   Pt need call back from nurse, wouldn't say why

## 2014-01-17 ENCOUNTER — Encounter (INDEPENDENT_AMBULATORY_CARE_PROVIDER_SITE_OTHER): Payer: Medicare Other | Admitting: Ophthalmology

## 2014-01-17 DIAGNOSIS — H3531 Nonexudative age-related macular degeneration: Secondary | ICD-10-CM

## 2014-01-17 DIAGNOSIS — I10 Essential (primary) hypertension: Secondary | ICD-10-CM

## 2014-01-17 DIAGNOSIS — H43813 Vitreous degeneration, bilateral: Secondary | ICD-10-CM

## 2014-01-17 DIAGNOSIS — H3532 Exudative age-related macular degeneration: Secondary | ICD-10-CM

## 2014-01-17 DIAGNOSIS — H35033 Hypertensive retinopathy, bilateral: Secondary | ICD-10-CM

## 2014-01-24 ENCOUNTER — Other Ambulatory Visit (INDEPENDENT_AMBULATORY_CARE_PROVIDER_SITE_OTHER): Payer: Medicare Other | Admitting: Ophthalmology

## 2014-01-24 DIAGNOSIS — H25032 Anterior subcapsular polar age-related cataract, left eye: Secondary | ICD-10-CM

## 2014-03-06 ENCOUNTER — Ambulatory Visit: Payer: Medicare Other | Admitting: Cardiology

## 2014-03-07 ENCOUNTER — Encounter: Payer: Self-pay | Admitting: Cardiology

## 2014-03-07 ENCOUNTER — Ambulatory Visit (INDEPENDENT_AMBULATORY_CARE_PROVIDER_SITE_OTHER): Payer: Medicare Other | Admitting: Cardiology

## 2014-03-07 VITALS — BP 136/50 | HR 50 | Ht 69.0 in | Wt 179.1 lb

## 2014-03-07 DIAGNOSIS — I480 Paroxysmal atrial fibrillation: Secondary | ICD-10-CM

## 2014-03-07 DIAGNOSIS — I2510 Atherosclerotic heart disease of native coronary artery without angina pectoris: Secondary | ICD-10-CM

## 2014-03-07 NOTE — Patient Instructions (Signed)
Your physician recommends that you return for lab work today--BMET/Liver profile/TSH/CBCd  Your physician wants you to follow-up in: 6 months with Dr Aundra Dubin. (June 2016).  You will receive a reminder letter in the mail two months in advance. If you don't receive a letter, please call our office to schedule the follow-up appointment.

## 2014-03-08 ENCOUNTER — Encounter (INDEPENDENT_AMBULATORY_CARE_PROVIDER_SITE_OTHER): Payer: Medicare Other | Admitting: Ophthalmology

## 2014-03-08 DIAGNOSIS — H3532 Exudative age-related macular degeneration: Secondary | ICD-10-CM

## 2014-03-08 DIAGNOSIS — H35033 Hypertensive retinopathy, bilateral: Secondary | ICD-10-CM

## 2014-03-08 DIAGNOSIS — H3531 Nonexudative age-related macular degeneration: Secondary | ICD-10-CM

## 2014-03-08 DIAGNOSIS — H43813 Vitreous degeneration, bilateral: Secondary | ICD-10-CM

## 2014-03-08 DIAGNOSIS — I10 Essential (primary) hypertension: Secondary | ICD-10-CM

## 2014-03-08 LAB — CBC WITH DIFFERENTIAL/PLATELET
Basophils Absolute: 0 10*3/uL (ref 0.0–0.1)
Basophils Relative: 0.6 % (ref 0.0–3.0)
EOS PCT: 1.5 % (ref 0.0–5.0)
Eosinophils Absolute: 0.1 10*3/uL (ref 0.0–0.7)
HCT: 27.7 % — ABNORMAL LOW (ref 39.0–52.0)
Hemoglobin: 9.2 g/dL — ABNORMAL LOW (ref 13.0–17.0)
LYMPHS ABS: 1.4 10*3/uL (ref 0.7–4.0)
Lymphocytes Relative: 38.4 % (ref 12.0–46.0)
MCHC: 33.1 g/dL (ref 30.0–36.0)
MCV: 94.7 fl (ref 78.0–100.0)
MONO ABS: 0.4 10*3/uL (ref 0.1–1.0)
Monocytes Relative: 10.3 % (ref 3.0–12.0)
NEUTROS ABS: 1.8 10*3/uL (ref 1.4–7.7)
NEUTROS PCT: 49.2 % (ref 43.0–77.0)
Platelets: 126 10*3/uL — ABNORMAL LOW (ref 150.0–400.0)
RBC: 2.93 Mil/uL — AB (ref 4.22–5.81)
RDW: 15.2 % (ref 11.5–15.5)
WBC: 3.7 10*3/uL — AB (ref 4.0–10.5)

## 2014-03-08 LAB — HEPATIC FUNCTION PANEL
ALBUMIN: 3.9 g/dL (ref 3.5–5.2)
ALK PHOS: 58 U/L (ref 39–117)
ALT: 13 U/L (ref 0–53)
AST: 22 U/L (ref 0–37)
BILIRUBIN DIRECT: 0.1 mg/dL (ref 0.0–0.3)
Total Bilirubin: 0.6 mg/dL (ref 0.2–1.2)
Total Protein: 6.7 g/dL (ref 6.0–8.3)

## 2014-03-08 LAB — BASIC METABOLIC PANEL
BUN: 35 mg/dL — AB (ref 6–23)
CALCIUM: 9 mg/dL (ref 8.4–10.5)
CO2: 25 mEq/L (ref 19–32)
Chloride: 109 mEq/L (ref 96–112)
Creatinine, Ser: 2.8 mg/dL — ABNORMAL HIGH (ref 0.4–1.5)
GFR: 23.4 mL/min — ABNORMAL LOW (ref >60.00)
Glucose, Bld: 90 mg/dL (ref 70–99)
Potassium: 4.4 mEq/L (ref 3.5–5.1)
Sodium: 140 mEq/L (ref 135–145)

## 2014-03-09 NOTE — Progress Notes (Signed)
Patient ID: GRISELDA TOSH, male   DOB: 03-08-1926, 80 y.o.   MRN: [id number] PCP: Dr. Harrington Challenger  80 yo with history of CAD s/p cath in 1991 showing a totally occluded RCA with no intervention presents today for cardiology followup.  In 2/15, he was noted to be in atrial fibrillation with RVR.  He had been feeling "bad" for about 3 wks with exertional dyspnea.  I started him on Xarelto and Lasix and brought him in later in 2/15 for TEE-guided DCCV.  He converted to NSR but had a lot of PACs after the procedure.  I therefore started him on amiodarone at 200 mg bid.  TEE showed EF 30% with diffuse hypokinesis and moderate MR. At a subsequent appointment he was back in atrial fibrillation.  I increased amiodarone, and he went back into NSR without another electrical cardioversion. He is in NSR today. Lexiscan Cardiolite in 3/15 showed EF 41% with moderate inferior MI, no significant ischemia.  Repeat echo in 5/15 showed EF 50-55% with hypokinesis of the basal to mid inferolateral and inferior walls.   Mr Goodson remains in NSR but is bradycardic.  No dyspnea walking on flat ground, and he can climb a flight of steps.  No chest pain.  He is not taking any Lasix and weight is stable.     ECG: NSR, RBBB  Labs (10/14): creatinine 1.69 Labs (2/15): K 4.5, creatinine 2.2=>1.9, BNP 692=>946, LDL 75, HDL 35, TSH normal Labs (3/15): K 3.6, creatinine 2.4 Labs (5/15): K 4.5, creatinine 2.2, LFTs normal, TSH normal Labs (9/15): K 4.6, creatinine 2.56, TSH normal, LFTs normal, HCT 32.2  PMH: 1. HTN 2. Hyperlipidemia 3. CAD: LHC 1991 with totally occluded RCA.  No intervention. Lexiscan Cardiolite in (3/15) showed EF 41%, moderate inferior infarction with minimal peri-infarct ischemia.  4. Atrial fibrillation: Diagnosed in 2/15.  TEE-guided cardioversion in 2/15 but went back into atrial fibrillation.  Amiodarone begun and patient went back into NSR.  5. 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  TEE (2/15) with EF  30%, diffuse hypokinesis, moderate MR, mildly decreased RV systolic function.  EF 41% by Lexiscan Cardiolite in 3/15.  Echo (5/15) with EF 50-55%, hypokinesis of the basal to mid inferolateral and inferior walls.  6. Macular degeneration 7. Sinus bradycardia  SH: Quit smoking in 1987.  Lives in [place].  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   ROS: All systems reviewed and negative except as noted in HPI.   Current Outpatient Prescriptions  Medication Sig Dispense Refill  . amiodarone (PACERONE) 200 MG tablet Take 0.5 tablets (100 mg total) by mouth daily.    Marland Kitchen amLODipine (NORVASC) 5 MG tablet Take 1 tablet (5 mg total) by mouth daily. 30 tablet 6  . furosemide (LASIX) 40 MG tablet 1/2 tablet (20mg )daily prn for weight gain 2-3 pounds in 1 day or 3-4 pounds in 1 week    . metoprolol succinate (TOPROL-XL) 25 MG 24 hr tablet Take 0.5 tablets (12.5 mg total) by mouth daily.    . Nutritional Supplements (PROSTA VITE PO) Take 1 tablet by mouth daily.    . pravastatin (PRAVACHOL) 80 MG tablet Take 80 mg by mouth daily.      . Rivaroxaban (XARELTO) 15 MG TABS tablet Take 1 tablet (15 mg total) by mouth daily with supper. 30 tablet 6   No current facility-administered medications for this visit.    BP 136/50 mmHg  Pulse 50  Ht 5\' 9"  (1.753 m)  Wt 179 lb 1.9 oz (81.248 kg)  BMI 26.44 kg/m2  SpO2 97% General: NAD Neck:  No JVD, no thyromegaly or thyroid nodule.  Lungs: Clear to auscultation bilaterally with normal respiratory effort. CV: Nondisplaced PMI.  Heart brady, regular S1/S2, no S3/S4, 1/6 HSM apex.  No edema.  No carotid bruit.  Normal pedal pulses.  Abdomen: Soft, nontender, no hepatosplenomegaly, no distention.  Neurologic: Alert and oriented x 3.  Psych: Normal affect. Extremities: No clubbing or cyanosis.   Assessment/Plan: 1. Atrial fibrillation: Patient is now in NSR on amiodarone.   - Continue amiodarone but with bradycardia it has been  decreased to 100 mg daily.  He needs LFTs and TSH today.  He has regular eye evaluation.  - Continue Xarelto 15 mg daily (adjusted for low GFR), check CBC.    2. Chronic systolic CHF: EF 91% with diffuse hypokinesis on TEE in 2/15, EF 41% on Cardiolite in 3/15. After being in NSR for a couple of months, EF was 50-55% by echo in 5/15. 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  Symptomatically doing better, NYHA class II.  He is not volume overloaded on exam. He is now off Lasix.  - No ACEI with elevated creatinine.  Toprol XL to be decreased with bradycardia. 3. CAD: Cardiolite showed prior inferior MI (known from past) without significant ischemia.  No ASA with stable CAD and on Xarelto.  Continue statin.  4. Hyperlipidemia: Continue statin, good lipids when last checked.  5. CKD: Creatinine 2.56 in 9/15, repeat BMET today.   6. HTN: BP not elevated.   Followup in 6 months.    [name] 03/09/2014

## 2014-03-11 ENCOUNTER — Other Ambulatory Visit: Payer: Medicare Other

## 2014-03-11 DIAGNOSIS — E038 Other specified hypothyroidism: Secondary | ICD-10-CM

## 2014-03-12 ENCOUNTER — Other Ambulatory Visit: Payer: Self-pay | Admitting: *Deleted

## 2014-03-12 DIAGNOSIS — I2510 Atherosclerotic heart disease of native coronary artery without angina pectoris: Secondary | ICD-10-CM

## 2014-03-12 DIAGNOSIS — I480 Paroxysmal atrial fibrillation: Secondary | ICD-10-CM

## 2014-03-12 DIAGNOSIS — D649 Anemia, unspecified: Secondary | ICD-10-CM

## 2014-03-12 LAB — TSH: TSH: 2.315 u[IU]/mL (ref 0.350–4.500)

## 2014-03-18 ENCOUNTER — Other Ambulatory Visit: Payer: Medicare Other

## 2014-03-18 ENCOUNTER — Telehealth: Payer: Self-pay | Admitting: Cardiology

## 2014-03-19 ENCOUNTER — Other Ambulatory Visit (INDEPENDENT_AMBULATORY_CARE_PROVIDER_SITE_OTHER): Payer: Medicare Other | Admitting: *Deleted

## 2014-03-19 DIAGNOSIS — I480 Paroxysmal atrial fibrillation: Secondary | ICD-10-CM

## 2014-03-19 DIAGNOSIS — D649 Anemia, unspecified: Secondary | ICD-10-CM

## 2014-03-19 DIAGNOSIS — I2510 Atherosclerotic heart disease of native coronary artery without angina pectoris: Secondary | ICD-10-CM

## 2014-03-19 LAB — CBC WITH DIFFERENTIAL/PLATELET
BASOS PCT: 0.7 % (ref 0.0–3.0)
Basophils Absolute: 0 10*3/uL (ref 0.0–0.1)
EOS PCT: 1.7 % (ref 0.0–5.0)
Eosinophils Absolute: 0.1 10*3/uL (ref 0.0–0.7)
HCT: 29.7 % — ABNORMAL LOW (ref 39.0–52.0)
Hemoglobin: 9.8 g/dL — ABNORMAL LOW (ref 13.0–17.0)
LYMPHS PCT: 38.5 % (ref 12.0–46.0)
Lymphs Abs: 1.4 10*3/uL (ref 0.7–4.0)
MCHC: 32.9 g/dL (ref 30.0–36.0)
MCV: 96 fl (ref 78.0–100.0)
Monocytes Absolute: 0.4 10*3/uL (ref 0.1–1.0)
Monocytes Relative: 9.6 % (ref 3.0–12.0)
NEUTROS PCT: 49.5 % (ref 43.0–77.0)
Neutro Abs: 1.9 10*3/uL (ref 1.4–7.7)
PLATELETS: 129 10*3/uL — AB (ref 150.0–400.0)
RBC: 3.09 Mil/uL — AB (ref 4.22–5.81)
RDW: 15.3 % (ref 11.5–15.5)
WBC: 3.7 10*3/uL — AB (ref 4.0–10.5)

## 2014-03-19 NOTE — Telephone Encounter (Signed)
Pt notified about lab results with verbal understanding.. Pt advised to keep his appt with GI 12/31.

## 2014-03-21 ENCOUNTER — Ambulatory Visit (INDEPENDENT_AMBULATORY_CARE_PROVIDER_SITE_OTHER): Payer: Medicare Other | Admitting: Physician Assistant

## 2014-03-21 ENCOUNTER — Encounter: Payer: Self-pay | Admitting: Physician Assistant

## 2014-03-21 ENCOUNTER — Other Ambulatory Visit (INDEPENDENT_AMBULATORY_CARE_PROVIDER_SITE_OTHER): Payer: Medicare Other

## 2014-03-21 VITALS — BP 134/50 | HR 60 | Ht 66.75 in | Wt 181.0 lb

## 2014-03-21 DIAGNOSIS — D649 Anemia, unspecified: Secondary | ICD-10-CM

## 2014-03-21 LAB — SEDIMENTATION RATE: SED RATE: 26 mm/h — AB (ref 0–22)

## 2014-03-21 LAB — FERRITIN: Ferritin: 11.9 ng/mL — ABNORMAL LOW (ref 22.0–322.0)

## 2014-03-21 LAB — IBC PANEL
Iron: 94 ug/dL (ref 42–165)
Saturation Ratios: 25.5 % (ref 20.0–50.0)
Transferrin: 262.8 mg/dL (ref 212.0–360.0)

## 2014-03-21 LAB — VITAMIN B12: Vitamin B-12: 76 pg/mL — ABNORMAL LOW (ref 211–911)

## 2014-03-21 NOTE — Patient Instructions (Signed)
Please go to the basement level to have your labs drawn today, 03-21-2014. We will call you with results.  Please come to our lab Tuesday 03-26-2014, anytime between the hours of 7:30am to 5:30 PM.  Lab closed for lunch from 1:30PM to 2:00 PM .

## 2014-03-21 NOTE — Progress Notes (Signed)
Patient ID: Christian Gillespie, male   DOB: September 15, 1925, 80 y.o.   MRN: [id number]   Subjective:    Patient ID: Christian Gillespie, male    DOB: Apr 04, 1925, 80 y.o.   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 He is referred today by Dr. Marigene Ehlers  after finding of a progressive anemia on routine labs.. Patient has history of atrial fibrillation and is maintained on Xarelto. He had had a previous cardioversion. He also has a cardiomyopathy with EF 30-40% and chronic kidney disease. He has not had any prior GI issues or any prior GI evaluation. Family history is negative for colon cancer and polyps. Review of labs showed hemoglobin in the 12 range in early 2015 in September 2015 hemoglobin 10.7 hematocrit of 32.2 on 03/19/2014 hemoglobin 9.8 hematocrit of 29.7 MCV of 96 and platelets 129.Marland Kitchen Patient was not clear why he was referred here today and did not know he was anemic. 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 He has no complaints of increase in fatigue ,shortness of breath, chest pain etc. His appetite is good and his weight has been stable,no complaints of abdominal pain, heartburn, indigestion ,nausea, dysphagia, no changes in bowel habits and has not noted any melena or hematochezia. He is not taking any aspirin or NSAIDs.  Review of Systems Pertinent positive and negative review of systems were noted in the above HPI section.  All other review of systems was otherwise negative.  Outpatient Encounter Prescriptions as of 03/21/2014  Medication Sig  . amiodarone (PACERONE) 200 MG tablet Take 0.5 tablets (100 mg total) by mouth daily.  Marland Kitchen amLODipine (NORVASC) 5 MG tablet Take 1 tablet (5 mg total) by mouth daily.  . furosemide (LASIX) 40 MG tablet 1/2 tablet (20mg )daily prn for weight gain 2-3 pounds in 1 day or 3-4 pounds in 1 week  . metoprolol succinate (TOPROL-XL) 25 MG 24 hr tablet Take 0.5 tablets (12.5 mg total) by mouth  daily.  . Nutritional Supplements (PROSTA VITE PO) Take 1 tablet by mouth daily.  . pravastatin (PRAVACHOL) 80 MG tablet Take 80 mg by mouth daily.    . Rivaroxaban (XARELTO) 15 MG TABS tablet Take 1 tablet (15 mg total) by mouth daily with supper.   Allergies  Allergen Reactions  . Codeine Other (See Comments)    dizzy   Patient Active Problem List   Diagnosis Date Noted  . Chronic systolic CHF (congestive heart failure) 05/29/2013  . Atrial fibrillation 04/30/2013  . CHF (congestive heart failure) 04/30/2013  . CAD (coronary artery disease) 09/01/2010  . Hypercholesterolemia 09/01/2010  . Hypertension 09/01/2010   History   Social History  . Marital Status: Married    Spouse Name: N/A    Number of Children: N/A  . Years of Education: N/A   Occupational History  . Not on file.   Social History Main Topics  . Smoking status: Former Smoker    Types: Cigarettes    Quit date: 03/22/1985  . Smokeless tobacco: Never Used  . Alcohol Use: No  . Drug Use: No  . Sexual Activity: Not on file   Other Topics Concern  . Not on file   Social History Narrative  [employer]-still working full time  Christian Gillespie family history includes Breast cancer in his mother.      Objective:    Filed Vitals:   03/21/14 0945  BP: 134/50  Pulse: 60    Physical Exam  well-developed elderly white male in no acute distress, appears younger than his stated age accompanied by his wife height 5 foot 6 weight 181 vitals as above. HEENT; nontraumatic normocephalic EOMI PERRLA sclera anicteric Supple; no JVD, Cardiovascular; regular rate and rhythm with S1-S2 no murmur or gallop, Pulmonary ;clear bilaterally, Abdomen; soft nontender nondistended bowel sounds are active there is no palpable mass or hepatosplenomegaly, Rectal; exam small external hemorrhoid stool is brown and Hemoccult negative no lesion palpable Extremities; no clubbing cyanosis or edema skin warm and dry, Psych; mood and affect  appropriate       Assessment & Plan:   #1  80 yo male with progressive normocytic anemia over the past 8 months. Patient is asymptomatic and stool is currently Hemoccult negative. It is not clear that this represents a GI blood loss. He may have a myelodysplastic process as platelet count also mildly decreased. #2 chronic anticoagulation with Xarelto #3 atrial fib flutter #4 coronary artery disease #5 hypertension #6 congestive heart failure/cardiomyopathy with EF of 30-40%  Plan; Will check iron studies, B12 and folate, CRP and sedimentation rate Patient will do Hemoccults at home with chemotherapy sure Plan to repeat CBC next week If evidence of Hemoccult-positive stool or iron deficiency he will definitely need endoscopic evaluation. Would like to avoid this at age 64 if possible. He will be established with Dr. Fuller Plan, with further plans based on results of labs etc.  Alfredia Ferguson PA-C 03/21/2014

## 2014-03-21 NOTE — Progress Notes (Signed)
Reviewed and agree with management plan. If either occult blood is noted in his stool or if iron deficiency is noted then further GI evaluation would be indicated. Otherwise further evaluation of his anemia with his PCP and/or Hematology.   Pricilla Riffle. Fuller Plan, MD [facility]

## 2014-03-27 ENCOUNTER — Other Ambulatory Visit (INDEPENDENT_AMBULATORY_CARE_PROVIDER_SITE_OTHER): Payer: Medicare Other

## 2014-03-27 DIAGNOSIS — D649 Anemia, unspecified: Secondary | ICD-10-CM

## 2014-03-27 LAB — FECAL OCCULT BLOOD, IMMUNOCHEMICAL: FECAL OCCULT BLD: POSITIVE — AB

## 2014-04-03 ENCOUNTER — Telehealth: Payer: Self-pay

## 2014-04-03 ENCOUNTER — Other Ambulatory Visit: Payer: Self-pay

## 2014-04-03 NOTE — Telephone Encounter (Signed)
RE: hold Xarelto for colon/egd  Received: Today    Larey Dresser, MD  Virgina Evener, LPN           Yes that is ok       Previous Messages     ----- Message -----   From: Virgina Evener, LPN   Sent: 6/38/4665 10:47 AM    To: Larey Dresser, MD  Subject: hold Xarelto for colon/egd            Particia Nearing,   Mr. Macomber had positive hemoccult cards. He will be scheduled for colon/EGD. Is he okay to stop his Xarelto at least 2 days prior to the procedures?   Thank you,  Virgina Evener, LPN for Dr Fuller Plan

## 2014-04-04 ENCOUNTER — Encounter (HOSPITAL_COMMUNITY): Payer: Self-pay | Admitting: Cardiology

## 2014-04-07 ENCOUNTER — Other Ambulatory Visit: Payer: Self-pay | Admitting: Cardiology

## 2014-04-09 ENCOUNTER — Other Ambulatory Visit: Payer: Self-pay | Admitting: Cardiology

## 2014-04-09 ENCOUNTER — Ambulatory Visit (AMBULATORY_SURGERY_CENTER): Payer: Self-pay

## 2014-04-09 VITALS — Ht 66.5 in | Wt 181.8 lb

## 2014-04-09 DIAGNOSIS — D649 Anemia, unspecified: Secondary | ICD-10-CM

## 2014-04-09 MED ORDER — MOVIPREP 100 G PO SOLR: ORAL | Status: DC

## 2014-04-09 NOTE — Progress Notes (Signed)
Per pt, no allergies to soy or egg products.Pt not taking any weight loss meds or using  O2 at home. 

## 2014-04-18 ENCOUNTER — Encounter: Payer: Self-pay | Admitting: Gastroenterology

## 2014-04-26 ENCOUNTER — Encounter: Payer: Self-pay | Admitting: Gastroenterology

## 2014-04-26 ENCOUNTER — Ambulatory Visit (AMBULATORY_SURGERY_CENTER): Payer: Medicare Other | Admitting: Gastroenterology

## 2014-04-26 VITALS — BP 169/68 | HR 54 | Temp 96.9°F | Resp 20 | Ht 66.2 in | Wt 181.0 lb

## 2014-04-26 DIAGNOSIS — D120 Benign neoplasm of cecum: Secondary | ICD-10-CM

## 2014-04-26 DIAGNOSIS — R195 Other fecal abnormalities: Secondary | ICD-10-CM

## 2014-04-26 DIAGNOSIS — D125 Benign neoplasm of sigmoid colon: Secondary | ICD-10-CM

## 2014-04-26 DIAGNOSIS — D509 Iron deficiency anemia, unspecified: Secondary | ICD-10-CM

## 2014-04-26 DIAGNOSIS — K3189 Other diseases of stomach and duodenum: Secondary | ICD-10-CM

## 2014-04-26 DIAGNOSIS — D124 Benign neoplasm of descending colon: Secondary | ICD-10-CM

## 2014-04-26 MED ORDER — SODIUM CHLORIDE 0.9 % IV SOLN: 500.0000 mL | INTRAVENOUS | Status: DC

## 2014-04-26 NOTE — Op Note (Addendum)
[place]  [address]. [place], [address]   COLONOSCOPY PROCEDURE REPORT PATIENT: Christian Gillespie, Christian Gillespie  MR#: [id number] BIRTHDATE: Aug 13, 1925 , 88  yrs. old GENDER: male ENDOSCOPIST: Ladene Artist, MD, [facility] REFERRED IT:GPQDIY, Elby Showers. PROCEDURE DATE:  04/26/2014 PROCEDURE:   Colonoscopy with snare polypectomy First Screening Colonoscopy - Avg.  risk and is 50 yrs.  old or older - No.  Prior Negative Screening - Now for repeat screening. N/A  History of Adenoma - Now for follow-up colonoscopy & has been > or = to 3 yrs.  N/A  Polyps Removed Today? Yes. ASA CLASS:   Class III INDICATIONS:heme-positive stool and iron deficiency anemia. MEDICATIONS: Monitored anesthesia care and Propofol 140 mg IV DESCRIPTION OF PROCEDURE:   After the risks benefits and alternatives of the procedure were thoroughly explained, informed consent was obtained.  The digital rectal exam revealed no abnormalities of the rectum.   The LB [id number]  endoscope was introduced through the anus and advanced to the cecum, which was identified by both the appendix and ileocecal valve. No adverse events experienced.   The quality of the prep was excellent, using MoviPrep  The instrument was then slowly withdrawn as the colon was fully examined.  COLON FINDINGS: Two sessile polyps measuring 5-6 mm in size were found at the cecum.  Polypectomies were performed with a cold snare.  The resection was complete, the polyp tissue was completely retrieved and sent to histology. Two sessile polyps measuring 5-7 mm in size were found in the sigmoid colon and descending colon. Polypectomies were performed with a cold snare.  The resection was complete, the polyp tissue was completely retrieved and sent to histology. An arteriovenous malformation measuring 30mm in size was found at the cecum.  There was moderate diverticulosis noted in the sigmoid colon with associated colonic spasm and  muscular hypertrophy.  The examination was otherwise normal.  Retroflexed views revealed internal Grade I hemorrhoids. The time to cecum=4 minutes 49 seconds.  Withdrawal time=11 minutes 40 seconds.  The scope was withdrawn and the procedure completed. COMPLICATIONS: There were no immediate complications. ENDOSCOPIC IMPRESSION: 1.   Two sessile polyps at the cecum; polypectomies performed with a cold snare 2.   Two sessile polyps in the sigmoid colon and descending colon; polypectomies performed with a cold snare 3.   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   Moderate diverticulosis noted in the sigmoid colon 5.   Grade l internal hemorrhoids RECOMMENDATIONS: 1.  Await pathology results 2.  Hold Aspirin and all other NSAIDS for 3 weeks. 3.  Given your age, you will not need another colonoscopy for colon cancer screening or polyp surveillance.  These types of tests usually stop around the age 99. 4.  Resume Xarelto in 4 days eSigned:  Ladene Artist, MD, [facility] 04/26/2014 3:21 PM  cc: Melinda Crutch, MD Revised: 04/26/2014 3:21 PM

## 2014-04-26 NOTE — Patient Instructions (Addendum)
YOU HAD AN ENDOSCOPIC PROCEDURE TODAY AT THE [facility]: Refer to the procedure report that was given to you for any specific questions about what was found during the examination.  If the procedure report does not answer your questions, please call your gastroenterologist to clarify.  If you requested that your care partner not be given the details of your procedure findings, then the procedure report has been included in a sealed envelope for you to review at your convenience later.  YOU SHOULD EXPECT: Some feelings of bloating in the abdomen. Passage of more gas than usual.  Walking can help get rid of the air that was put into your GI tract during the procedure and reduce the bloating. If you had a lower endoscopy (such as a colonoscopy or flexible sigmoidoscopy) you may notice spotting of blood in your stool or on the toilet paper. If you underwent a bowel prep for your procedure, then you may not have a normal bowel movement for a few days.  DIET: Your first meal following the procedure should be a light meal and then it is ok to progress to your normal diet.  A half-sandwich or bowl of soup is an example of a good first meal.  Heavy or fried foods are harder to digest and may make you feel nauseous or bloated.  Likewise meals heavy in dairy and vegetables can cause extra gas to form and this can also increase the bloating.  Drink plenty of fluids but you should avoid alcoholic beverages for 24 hours.  ACTIVITY: Your care partner should take you home directly after the procedure.  You should plan to take it easy, moving slowly for the rest of the day.  You can resume normal activity the day after the procedure however you should NOT DRIVE or use heavy machinery for 24 hours (because of the sedation medicines used during the test).    SYMPTOMS TO REPORT IMMEDIATELY: A gastroenterologist can be reached at any hour.  During normal business hours, 8:30 AM to 5:00 PM Monday through Friday,  call [phone].  After hours and on weekends, please call the GI answering service at [phone] who will take a message and have the physician on call contact you.   Following lower endoscopy (colonoscopy or flexible sigmoidoscopy):  Excessive amounts of blood in the stool  Significant tenderness or worsening of abdominal pains  Swelling of the abdomen that is new, acute  Fever of 100F or higher  Following upper endoscopy (EGD)  Vomiting of blood or coffee ground material  New chest pain or pain under the shoulder blades  Painful or persistently difficult swallowing  New shortness of breath  Fever of 100F or higher  Black, tarry-looking stools  FOLLOW UP: If any biopsies were taken you will be contacted by phone or by letter within the next 1-3 weeks.  Call your gastroenterologist if you have not heard about the biopsies in 3 weeks.  Our staff will call the home number listed on your records the next business day following your procedure to check on you and address any questions or concerns that you may have at that time regarding the information given to you following your procedure. This is a courtesy call and so if there is no answer at the home number and we have not heard from you through the emergency physician on call, we will assume that you have returned to your regular daily activities without incident.  SIGNATURES/CONFIDENTIALITY: You and/or your care   partner have signed paperwork which will be entered into your electronic medical record.  These signatures attest to the fact that that the information above on your After Visit Summary has been reviewed and is understood.  Full responsibility of the confidentiality of this discharge information lies with you and/or your care-partner.     Handouts were given to your care partner on polyps, diverticulosis, hemorrhoids and a high fiber diet with liberal fluid intake. You might notice some irritation in your nose or  drainage.  This may cause feelings of congestion.  This is from the oxygen, which can be drying.  This is no cause for concern; this should clear up in a few days.  You may resume your current medications today.  Per Dr. Fuller Plan restart your Xarelto in 4 days on Tuesday.  Hold aspirin and all other anti-inflammatory meds for 3 weeks. Follow up with PCP for B-12 and iron replacement per Dr. Fuller Plan.  Also restart taking by mouth iron. Await biopsy results.  Please call if any questions or concerns.

## 2014-04-26 NOTE — Progress Notes (Signed)
Stable to RR 

## 2014-04-26 NOTE — Progress Notes (Signed)
No problems noted in the recovery room. maw 

## 2014-04-26 NOTE — Progress Notes (Signed)
Called to room to assist during endoscopic procedure.  Patient ID and intended procedure confirmed with present staff. Received instructions for my participation in the procedure from the performing physician.  

## 2014-04-26 NOTE — Op Note (Signed)
[place]  [address]. [place], [address]   ENDOSCOPY PROCEDURE REPORT  PATIENT: Christian Gillespie, Christian Gillespie  MR#: [id number] BIRTHDATE: Dec 23, 1925 , 4  yrs. old GENDER: male ENDOSCOPIST: Ladene Artist, MD, [facility] REFERRED BY:  Larey Dresser. PROCEDURE DATE:  04/26/2014 PROCEDURE:  EGD w/ biopsy ASA CLASS:     Class III INDICATIONS:  iron deficiency anemia and heme positive stool. MEDICATIONS: Monitored anesthesia care, Residual sedation present, and Propofol 60 mg IV TOPICAL ANESTHETIC: none DESCRIPTION OF PROCEDURE: After the risks benefits and alternatives of the procedure were thoroughly explained, informed consent was obtained.  The LB [id number] endoscope was introduced through the mouth and advanced to the second portion of the duodenum , Without limitations.  The instrument was slowly withdrawn as the mucosa was fully examined.    ESOPHAGUS: The mucosa of the esophagus appeared normal. STOMACH: Atrophic gastritis was found in the gastric fundus and gastric body. A single 5 mm nodule was located at the cardia. Multiple biopsies was performed.  The stomach otherwise appeared normal. DUODENUM: The duodenal mucosa showed no abnormalities in the bulb and 2nd part of the duodenum.  Retroflexed views revealed no abnormalities except for the nodule as above.  The scope was then withdrawn from the patient and the procedure completed.  COMPLICATIONS: There were no immediate complications.  ENDOSCOPIC IMPRESSION: 1.   Atrophic gastritis in the gastric fundus and gastric body 2.   Nodule in the gastric cardia; multiple biopsies performed 3.   The EGD otherwise appeared normal  RECOMMENDATIONS: 1.  Await pathology results 2.  Iron deficiency likely from blood loss due to the cecal AVM and/or poor iron absorption due to atrophic gastritis 3.  Follow up with PCP for B12 and iron replacement  eSigned:  Ladene Artist, MD, [facility] 04/26/2014 3:19  PM   GN:OIBB Harrington Challenger, MD

## 2014-04-29 ENCOUNTER — Telehealth: Payer: Self-pay

## 2014-04-29 NOTE — Telephone Encounter (Signed)
Left a message at [phone] which had the pt name stated.  Pt to call us back if any questions or concerns. maw

## 2014-04-30 ENCOUNTER — Encounter: Payer: Self-pay | Admitting: Gastroenterology

## 2014-04-30 ENCOUNTER — Encounter: Payer: Self-pay | Admitting: Physician Assistant

## 2014-04-30 ENCOUNTER — Ambulatory Visit (INDEPENDENT_AMBULATORY_CARE_PROVIDER_SITE_OTHER): Payer: Medicare Other | Admitting: Physician Assistant

## 2014-04-30 ENCOUNTER — Telehealth: Payer: Self-pay | Admitting: Cardiology

## 2014-04-30 VITALS — BP 138/58 | HR 65 | Ht 66.0 in | Wt 182.0 lb

## 2014-04-30 DIAGNOSIS — N189 Chronic kidney disease, unspecified: Secondary | ICD-10-CM

## 2014-04-30 DIAGNOSIS — E785 Hyperlipidemia, unspecified: Secondary | ICD-10-CM

## 2014-04-30 DIAGNOSIS — I10 Essential (primary) hypertension: Secondary | ICD-10-CM

## 2014-04-30 DIAGNOSIS — D6489 Other specified anemias: Secondary | ICD-10-CM

## 2014-04-30 DIAGNOSIS — I480 Paroxysmal atrial fibrillation: Secondary | ICD-10-CM

## 2014-04-30 DIAGNOSIS — I208 Other forms of angina pectoris: Secondary | ICD-10-CM

## 2014-04-30 DIAGNOSIS — I25119 Atherosclerotic heart disease of native coronary artery with unspecified angina pectoris: Secondary | ICD-10-CM

## 2014-04-30 DIAGNOSIS — I2510 Atherosclerotic heart disease of native coronary artery without angina pectoris: Secondary | ICD-10-CM

## 2014-04-30 MED ORDER — ISOSORBIDE MONONITRATE ER 30 MG PO TB24: 30.0000 mg | ORAL_TABLET | Freq: Every day | ORAL | Status: DC

## 2014-04-30 MED ORDER — NITROGLYCERIN 0.4 MG/SPRAY TL SOLN: 1.0000 | Status: DC | PRN

## 2014-04-30 NOTE — Telephone Encounter (Signed)
I reviewed with Richardson Dopp, PA-C and have scheduled pt to see Nicki Reaper today at [facility]. Pt advised and agreed with this plan.

## 2014-04-30 NOTE — Telephone Encounter (Signed)
New Prob   Pt reports some discomfort in his back intermittently. Requesting to speak to nurse regarding this.

## 2014-04-30 NOTE — Telephone Encounter (Signed)
Pt states for several weeks he has had a discomfort across his shoulder blades with exertion.  Pt cannot recall having discomfort like this in the past, the last time it happened was about 2 days ago, not associated with any other symptoms. Pt states it does not always resolve with stopping activity and can last varying lengths of time.

## 2014-04-30 NOTE — Progress Notes (Signed)
Cardiology Office Note   Date:  04/30/2014   ID:  Christian Gillespie, DOB 10-03-1925, MRN [id number]  Patient Care Team: Melinda Crutch, MD as PCP - General (Family Medicine) Larey Dresser, MD as Consulting Physician (Cardiology) Elmarie Shiley, MD as Consulting Physician (Nephrology)    Chief Complaint  Patient presents with  . Exertional Arm Pain  . Coronary Artery Disease     History of Present Illness: Christian Gillespie is a 81 y.o. male with a hx of CAD s/p cath in 1991 showing a totally occluded RCA with no intervention, CKD, HTN, HL. In 2/15, he was noted to be in atrial fibrillation with RVR. He had been feeling "bad" for about 3 wks with exertional dyspnea. He was started on Xarelto and Lasix and brought in later in 2/15 for TEE-guided DCCV. He converted to NSR but had a lot of PACs after the procedure. He was started on amiodarone at 200 mg bid. TEE showed EF 30% with diffuse hypokinesis and moderate MR. At a subsequent appointment he was back in atrial fibrillation and his amiodarone was increased.  He went back into NSR without another electrical cardioversion. He remained in NSR when last seen by Dr. [name] 03/07/14. Lexiscan Cardiolite in 3/15 showed EF 41% with moderate inferior MI, no significant ischemia. Repeat echo in 5/15 showed EF 50-55% with hypokinesis of the basal to mid inferolateral and inferior walls.   Recently referred to GI for worsening anemia.  Hemoccult was + and colonoscopy demonstrated polyps which were removed and cecal AVMs.  He also gastritis and gastric nodule which was biopsied.  Pathology was negative for malignant cells.  He called in today with some arm pain with exertion and was added on for evaluation.  He tells me that he has started to notice interscapular back pain and bilateral arm pain with exertion over the past 2 weeks.  This tends to occur if he over-exerts himself.  He denies rest symptoms.  He does note some assoc dyspnea, but no assoc  nausea, diaphoresis or jaw pain.  He denies orthopnea, PND, edema. He denies syncope.   Studies/Reports Reviewed Today:  2 D Echocardiogram 07/30/13 - Mild focal basal hypertrophy of the septum. EF 50% to 55%. There is hypokinesis of the basal-midinferolateral and inferior myocardium. - Left atrium: The atrium was mildly dilated.  Nuclear Stress Test 05/22/13 Intermediate risk Moderate inferior wall infarct from apex to base with minimal periinfarct ischemia involving infero-septum EF 41%.   Colonoscopy 04/26/14 1. Two sessile polyps at the cecum; polypectomies performed with a cold snare 2. Two sessile polyps in the sigmoid colon and descending colon; polypectomies performed with a cold snare 3. 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 Moderate diverticulosis noted in the sigmoid colon 5. Grade l internal hemorrhoids  EGD 04/26/14 1. Atrophic gastritis in the gastric fundus and gastric body 2. Nodule in the gastric cardia; multiple biopsies performed 3. The EGD otherwise appeared normal RECOMMENDATIONS: 1. Await pathology results 2. Iron deficiency likely from blood loss due to the cecal AVM and/or poor iron absorption due to atrophic gastritis   Past Medical History  Diagnosis Date  . CAD (coronary artery disease)     a. [place] 1991 with totally occluded RCA. No intervention;  b. Lexiscan Cardiolite in (3/15) showed EF 41%, moderate inferior infarction with minimal peri-infarct ischemia.  . Atrial fibrillation     Diagnosed in 2/15. TEE-guided cardioversion in 2/15 but went back into atrial fibrillation. Amiodarone begun and patient  went back into NSR.   Marland Kitchen Cardiomyopathy     a. Suspect tachycardia-mediated >> TEE (2/15) with EF 30%, diffuse hypokinesis, moderate MR, mildly decreased RV systolic function; EF 81% by Lexiscan Cardiolite in 3/15;  b.  Echo (5/15) with EF 50-55%, hypokinesis of the basal to mid inferolateral and inferior walls.   Marland Kitchen PVC (premature ventricular  contraction)   . Kidney stones   . Hypertension   . Hyperlipidemia   . Hard of hearing     hearing aid right ear/hole in left eardrum  . Macular degeneration   . Sinus bradycardia     Past Surgical History  Procedure Laterality Date  . Cystoscopy w/ retrogrades  07/19/2003    Double-J stent placement  . Cystoscopy w/ retrogrades  08/08/2001    Double-J stent placement, Stone manipulation, Ureteroscopy  . Appendectomy  1952  . Tonsillectomy      as a child  . Cardiac catheterization  08/02/1989    EF - between 40 and 50%  . Tee without cardioversion N/A 05/04/2013    Procedure: TRANSESOPHAGEAL ECHOCARDIOGRAM (TEE);  Surgeon: Larey Dresser, MD;  Location: [facility];  Service: Cardiovascular;  Laterality: N/A;  . Cardioversion N/A 05/04/2013    Procedure: CARDIOVERSION;  Surgeon: Larey Dresser, MD;  Location: [facility];  Service: Cardiovascular;  Laterality: N/A;     Current Outpatient Prescriptions  Medication Sig Dispense Refill  . amiodarone (PACERONE) 200 MG tablet TAKE 1 TABLET TWICE A DAY FOR 1 WEEK THEN 1 TABLET DAILY 90 tablet 1  . amLODipine (NORVASC) 5 MG tablet Take 1 tablet (5 mg total) by mouth daily. 30 tablet 6  . Cyanocobalamin (B-12 COMPLIANCE INJECTION IJ) Inject as directed every 30 (thirty) days.    . metoprolol succinate (TOPROL-XL) 25 MG 24 hr tablet Take 0.5 tablets (12.5 mg total) by mouth daily.    . Nutritional Supplements (PROSTA VITE PO) Take 1 tablet by mouth daily.    . pravastatin (PRAVACHOL) 80 MG tablet Take 80 mg by mouth daily.      . Rivaroxaban (XARELTO) 15 MG TABS tablet Take 1 tablet (15 mg total) by mouth daily with supper. 30 tablet 6   No current facility-administered medications for this visit.    Allergies:   Codeine    Social History:  The patient  reports that he quit smoking about 30 years ago. His smoking use included Cigarettes. He has never used smokeless tobacco. He reports that he does not drink alcohol or use  illicit drugs.   Family History:  The patient's family history includes Breast cancer in his mother; Cancer in his mother. There is no history of Heart attack or Stroke.    ROS:  Please see the history of present illness.   Otherwise, review of systems are positive for none.   All other systems are reviewed and negative.    PHYSICAL EXAM: VS:  BP 138/58 mmHg  Pulse 65  Ht 5\' 6"  (1.676 m)  Wt 182 lb (82.555 kg)  BMI 29.39 kg/m2    Wt Readings from Last 3 Encounters:  04/30/14 182 lb (82.555 kg)  04/26/14 181 lb (82.101 kg)  04/09/14 181 lb 12.8 oz (82.464 kg)     GEN: Well nourished, well developed, in no acute distress HEENT: normal Neck: no JVD, no masses Cardiac:  Normal S1/S2, RRR; no murmur, no rubs or gallops, no edema  Respiratory:  clear to auscultation bilaterally, no wheezing, rhonchi or rales. GI: soft, nontender, nondistended, + BS  MS: no deformity or atrophy Skin: warm and dry  Neuro:  CNs II-XII intact, Strength and sensation are intact Psych: Normal affect   EKG:  EKG is ordered today.  It demonstrates:   NSR, HR 65, right axis, RBBB, lat TWI (new)   Recent Labs: 05/09/2013: Pro B Natriuretic peptide (BNP) 946.0* 03/07/2014: ALT 13; BUN 35*; Creatinine 2.8*; Potassium 4.4; Sodium 140 03/11/2014: TSH 2.315 03/19/2014: Hemoglobin 9.8*; Platelets 129.0*     Lipid Panel    Component Value Date/Time   CHOL 122 04/30/2013 1555   TRIG 63.0 04/30/2013 1555   HDL 34.50* 04/30/2013 1555   CHOLHDL 4 04/30/2013 1555   VLDL 12.6 04/30/2013 1555   LDLCALC 75 04/30/2013 1555      ASSESSMENT AND PLAN:  1.  Exertional Angina:  He is describing symptoms that are fairly consistent with exertional angina.  He is probably describing CCS 2-3 symptoms.  His ECG demonstrates lateral TWI that appear to be new.  He denies rest symptoms.  His creatinine is 2.8 and GFR is 22.  He is very high risk for CIN with cardiac cath.  I will try to proceed with medical therapy  initially.    -  Start Imdur 30 mg QD.    -  Rx for prn NTG spray.    -  Arrange Lexiscan Myoview.    -  If he fails medical Rx or has high risk features on his Myoview, will need to discuss with nephrology +/- cardiac cath. 2.  Anemia:  Recent EGD/colo with cecal AVM and colon polyps.  Suspected Fe deficiency anemia due to bleeding from cecal AVM.  With his CKD, I suspect he also has anemia of chronic disease.      -  Check BMET, CBC today to rule out increased bleeding. 3.  Atrial Fibrillation:  Maintaining NSR on Amiodarone.  Recent TSH and ALT ok.  He is on the correct dose of Xarelto. 4.  Coronary Artery Disease:  As noted, I suspect his symptoms are anginal.  He is not on ASA as he is on Xarelto.  Continue beta blocker, amlodipine, statin.    -  Add nitrates as noted.  He does not take PDE-5 inhibitors. 5.  Hypertension:  BP at target.  He should be able to tolerate long acting nitrates.  6.  Hyperlipidemia:  Continue statin Rx.  LDL optimal in 2/15. 7.  Chronic Kidney Disease, Stage IV:  FU with nephrology as planned.  Repeat BMET today.  Current medicines are reviewed at length with the patient today.  The patient does not have concerns regarding medicines.  The following changes have been made:  As above.   Labs/ tests ordered today include:  Orders Placed This Encounter  Procedures  . CBC w/Diff  . Basic Metabolic Panel (BMET)  . Myocardial Perfusion Imaging  . EKG 12-Lead    Disposition:   FU with Dr. [name] or me in  1-2 weeks.   Signed, Versie Starks, MHS 04/30/2014 3:13 PM    [facility] [place], [place], [place]  [address] Phone: [phone]; Fax: [phone]

## 2014-04-30 NOTE — Patient Instructions (Signed)
Your physician has requested that you have a lexiscan myoview. For further information please visit [email or web address]. Please follow instruction sheet, as given.  Your physician has recommended you make the following change in your medication:  1. START IMDUR 30 MG 1 TABLET DAILY; RX SENT IN 2. AN RX FOR NTG SPRAY HAS BEEN SENT IN   LAB WORK TODAY; BMET, CBC W/DIFF   Your physician recommends that you schedule a follow-up appointment in: [place], [place] IS IN THE OFFICE

## 2014-05-01 LAB — BASIC METABOLIC PANEL
BUN: 28 mg/dL — ABNORMAL HIGH (ref 6–23)
CALCIUM: 8.9 mg/dL (ref 8.4–10.5)
CO2: 28 meq/L (ref 19–32)
Chloride: 108 mEq/L (ref 96–112)
Creatinine, Ser: 2.31 mg/dL — ABNORMAL HIGH (ref 0.40–1.50)
GFR: 28.6 mL/min — ABNORMAL LOW (ref >60.00)
Glucose, Bld: 117 mg/dL — ABNORMAL HIGH (ref 70–99)
Potassium: 4.4 mEq/L (ref 3.5–5.1)
Sodium: 141 mEq/L (ref 135–145)

## 2014-05-01 LAB — CBC WITH DIFFERENTIAL/PLATELET
BASOS PCT: 0.5 % (ref 0.0–3.0)
Basophils Absolute: 0 10*3/uL (ref 0.0–0.1)
EOS ABS: 0 10*3/uL (ref 0.0–0.7)
Eosinophils Relative: 1.7 % (ref 0.0–5.0)
HCT: 28.5 % — ABNORMAL LOW (ref 39.0–52.0)
Hemoglobin: 9.6 g/dL — ABNORMAL LOW (ref 13.0–17.0)
LYMPHS ABS: 0.8 10*3/uL (ref 0.7–4.0)
Lymphocytes Relative: 29.5 % (ref 12.0–46.0)
MCHC: 33.5 g/dL (ref 30.0–36.0)
MCV: 95.6 fl (ref 78.0–100.0)
MONO ABS: 0.2 10*3/uL (ref 0.1–1.0)
Monocytes Relative: 6.7 % (ref 3.0–12.0)
NEUTROS ABS: 1.7 10*3/uL (ref 1.4–7.7)
NEUTROS PCT: 61.6 % (ref 43.0–77.0)
Platelets: 92 10*3/uL — ABNORMAL LOW (ref 150.0–400.0)
RBC: 2.98 Mil/uL — ABNORMAL LOW (ref 4.22–5.81)
RDW: 16.4 % — ABNORMAL HIGH (ref 11.5–15.5)
WBC: 2.8 10*3/uL — ABNORMAL LOW (ref 4.0–10.5)

## 2014-05-02 ENCOUNTER — Other Ambulatory Visit: Payer: Self-pay

## 2014-05-02 MED ORDER — NITROGLYCERIN 0.4 MG SL SUBL: 0.4000 mg | SUBLINGUAL_TABLET | SUBLINGUAL | Status: AC | PRN

## 2014-05-09 ENCOUNTER — Ambulatory Visit (HOSPITAL_COMMUNITY): Payer: Medicare Other | Attending: Cardiology | Admitting: Radiology

## 2014-05-09 DIAGNOSIS — I4510 Unspecified right bundle-branch block: Secondary | ICD-10-CM | POA: Insufficient documentation

## 2014-05-09 DIAGNOSIS — I10 Essential (primary) hypertension: Secondary | ICD-10-CM | POA: Diagnosis not present

## 2014-05-09 DIAGNOSIS — R9431 Abnormal electrocardiogram [ECG] [EKG]: Secondary | ICD-10-CM | POA: Insufficient documentation

## 2014-05-09 DIAGNOSIS — I208 Other forms of angina pectoris: Secondary | ICD-10-CM

## 2014-05-09 DIAGNOSIS — I2510 Atherosclerotic heart disease of native coronary artery without angina pectoris: Secondary | ICD-10-CM | POA: Insufficient documentation

## 2014-05-09 DIAGNOSIS — R0609 Other forms of dyspnea: Secondary | ICD-10-CM | POA: Diagnosis not present

## 2014-05-09 DIAGNOSIS — I25119 Atherosclerotic heart disease of native coronary artery with unspecified angina pectoris: Secondary | ICD-10-CM

## 2014-05-09 MED ORDER — TECHNETIUM TC 99M SESTAMIBI GENERIC - CARDIOLITE
30.0000 | Freq: Once | INTRAVENOUS | Status: AC | PRN
  Administered 2014-05-09: 30 via INTRAVENOUS

## 2014-05-09 MED ORDER — TECHNETIUM TC 99M SESTAMIBI GENERIC - CARDIOLITE
10.0000 | Freq: Once | INTRAVENOUS | Status: AC | PRN
  Administered 2014-05-09: 10 via INTRAVENOUS

## 2014-05-09 MED ORDER — REGADENOSON 0.4 MG/5ML IV SOLN
0.4000 mg | Freq: Once | INTRAVENOUS | Status: AC
  Administered 2014-05-09: 0.4 mg via INTRAVENOUS

## 2014-05-09 NOTE — Progress Notes (Signed)
[facility] [address] [place], [place] [address] [phone]    Cardiology Nuclear Med Study  Christian Gillespie is a 81 y.o. male     MRN : [id number]     DOB: September 17, 1925  Procedure Date: 05/09/2014  Nuclear Med Background Indication for Stress Test:  Evaluation for Ischemia, Follow up CAD, and Abnormal EKG History:  CAD, 2015 MPI No Ischemia, EF 41% Cardiac Risk Factors: Hypertension and RBBB  Symptoms:  DOE   Nuclear Pre-Procedure Caffeine/Decaff Intake:  None> 12 hrs NPO After: 7:30am   Lungs:  clear O2 Sat: 98% on room air. IV 0.9% NS with Angio Cath:  22g  IV Site: R Antecubital x 1, tolerated well IV Started by:  Irven Baltimore, RN  Chest Size (in):  46 Cup Size: n/a  Height: 5\' 6"  (1.676 m)  Weight:  178 lb (80.74 kg)  BMI:  Body mass index is 28.74 kg/(m^2). Tech Comments:  Patient took morning medications. Irven Baltimore, RN.    Nuclear Med Study 1 or 2 day study: 1 day  Stress Test Type:  Carlton Adam  Reading MD: N/A  Order Authorizing Provider:  [name], MD  Resting Radionuclide: Technetium 52m Sestamibi  Resting Radionuclide Dose: 11.0 mCi   Stress Radionuclide:  Technetium 54m Sestamibi  Stress Radionuclide Dose: 33.0 mCi           Stress Protocol Rest HR: 55 Stress HR: 72  Rest BP: 157/65 Stress BP: 163/56  Exercise Time (min): n/a METS: n/a   Predicted Max HR: 134 bpm % Max HR: 53.73 bpm Rate Pressure Product: 11736   Dose of Adenosine (mg):  n/a Dose of Lexiscan: 0.4 mg  Dose of Atropine (mg): n/a Dose of Dobutamine: n/a mcg/kg/min (at max HR)  Stress Test Technologist: Crissie Figures, RN  Nuclear Technologist:  Earl Many, CNMT     Rest Procedure:  Myocardial perfusion imaging was performed at rest 45 minutes following the intravenous administration of Technetium 41m Sestamibi. Rest ECG: NSR-RBBB  Stress Procedure:  The patient received IV Lexiscan 0.4 mg over 15-seconds.  Technetium 72m Sestamibi injected at  30-seconds.  Quantitative spect images were obtained after a 45 minute delay. Stress ECG: There are scattered PACs.  QPS Raw Data Images:  Mild diaphragmatic attenuation.  Normal left ventricular size. Stress Images:  There is decreased uptake in the inferior wall. Rest Images:  There is decreased uptake in the inferior wall. Subtraction (SDS):  No evidence of ischemia.There is a fixed inferior wall defect consistent with prior infarct. Transient Ischemic Dilatation (Normal <1.22):  1.23 Lung/Heart Ratio (Normal <0.45):  0.42  Quantitative Gated Spect Images QGS EDV:  177 ml QGS ESV:  100 ml  Impression Exercise Capacity:  Lexiscan with no exercise. BP Response:  Normal blood pressure response. Clinical Symptoms:  No significant symptoms noted. ECG Impression:  No significant ECG changes with Lexiscan. Comparison with Prior Nuclear Study: No significant change from previous study 05/2013  Overall Impression:  Intermediate risk stress nuclear study with a medium sized, severe in intensity fixed defect consistent with prior infarct from the base to the apex with no evidence of ischemia. .  LV Ejection Fraction: 44%.  LV Wall Motion:  Mildly reduced LVF   Signed: Fransico Him, MD [facility] 05/09/2014

## 2014-05-10 ENCOUNTER — Encounter: Payer: Self-pay | Admitting: Physician Assistant

## 2014-05-15 ENCOUNTER — Encounter (INDEPENDENT_AMBULATORY_CARE_PROVIDER_SITE_OTHER): Payer: Medicare Other | Admitting: Ophthalmology

## 2014-05-15 DIAGNOSIS — H31412 Hemorrhagic choroidal detachment, left eye: Secondary | ICD-10-CM

## 2014-05-15 DIAGNOSIS — I10 Essential (primary) hypertension: Secondary | ICD-10-CM

## 2014-05-15 DIAGNOSIS — H3531 Nonexudative age-related macular degeneration: Secondary | ICD-10-CM

## 2014-05-15 DIAGNOSIS — H35033 Hypertensive retinopathy, bilateral: Secondary | ICD-10-CM

## 2014-05-15 DIAGNOSIS — H3532 Exudative age-related macular degeneration: Secondary | ICD-10-CM

## 2014-05-15 DIAGNOSIS — H43813 Vitreous degeneration, bilateral: Secondary | ICD-10-CM

## 2014-05-15 DIAGNOSIS — H4312 Vitreous hemorrhage, left eye: Secondary | ICD-10-CM

## 2014-05-16 ENCOUNTER — Encounter: Payer: Self-pay | Admitting: Physician Assistant

## 2014-05-16 ENCOUNTER — Ambulatory Visit (INDEPENDENT_AMBULATORY_CARE_PROVIDER_SITE_OTHER): Payer: Medicare Other | Admitting: Physician Assistant

## 2014-05-16 VITALS — BP 120/50 | HR 58 | Ht 66.0 in | Wt 179.0 lb

## 2014-05-16 DIAGNOSIS — I208 Other forms of angina pectoris: Secondary | ICD-10-CM

## 2014-05-16 DIAGNOSIS — N189 Chronic kidney disease, unspecified: Secondary | ICD-10-CM

## 2014-05-16 DIAGNOSIS — D6489 Other specified anemias: Secondary | ICD-10-CM

## 2014-05-16 DIAGNOSIS — I480 Paroxysmal atrial fibrillation: Secondary | ICD-10-CM

## 2014-05-16 DIAGNOSIS — E785 Hyperlipidemia, unspecified: Secondary | ICD-10-CM

## 2014-05-16 DIAGNOSIS — I25119 Atherosclerotic heart disease of native coronary artery with unspecified angina pectoris: Secondary | ICD-10-CM

## 2014-05-16 DIAGNOSIS — I2089 Other forms of angina pectoris: Secondary | ICD-10-CM

## 2014-05-16 DIAGNOSIS — I10 Essential (primary) hypertension: Secondary | ICD-10-CM

## 2014-05-16 MED ORDER — APIXABAN 2.5 MG PO TABS: 2.5000 mg | ORAL_TABLET | Freq: Two times a day (BID) | ORAL | Status: DC

## 2014-05-16 NOTE — Patient Instructions (Signed)
Your physician has recommended you make the following change in your medication:    Uncertain.Marland Kitchen AFTER LAST DOSE   THEN START TAKING ELIQUIS 2.5 MG TWICE A DAY  ( START THE FIRST DOSE AT THE SAME TIME YOU WOULD HAVE NORMALLY TAKEN [place] )    Your physician recommends that you schedule a follow-up appointment in:   WITH [facility] IN 8 WEEKS

## 2014-05-16 NOTE — Progress Notes (Signed)
Cardiology Office Note   Date:  05/16/2014   ID:  Christian Gillespie, DOB December 30, 1925, MRN [id number]  Patient Care Team: Melinda Crutch, MD as PCP - General (Family Medicine) Larey Dresser, MD as Consulting Physician (Cardiology) Elmarie Shiley, MD as Consulting Physician (Nephrology)    Chief Complaint  Patient presents with  . Coronary Artery Disease     History of Present Illness: Christian Gillespie is a 80 y.o. male with a hx of CAD s/p cath in 1991 showing a totally occluded RCA with no intervention, CKD, HTN, HL. In 2/15, he was noted to be in atrial fibrillation with RVR. He had been feeling "bad" for about 3 wks with exertional dyspnea. He was started on Xarelto and Lasix and brought in later in 2/15 for TEE-guided DCCV. He converted to NSR but had a lot of PACs after the procedure. He was started on amiodarone at 200 mg bid. TEE showed EF 30% with diffuse hypokinesis and moderate MR. At a subsequent appointment he was back in atrial fibrillation and his amiodarone was increased.  He went back into NSR without another electrical cardioversion. He remained in NSR when last seen by Dr. Loralie Gillespie 03/07/14. Lexiscan Cardiolite in 3/15 showed EF 41% with moderate inferior MI, no significant ischemia. Repeat echo in 5/15 showed EF 50-55% with hypokinesis of the basal to mid inferolateral and inferior walls.   Recently referred to GI for worsening anemia.  Hemoccult was + and colonoscopy demonstrated polyps which were removed and cecal AVMs.  He also gastritis and gastric nodule which was biopsied.  Pathology was negative for malignant cells.  I saw him 04/30/14 with complaints of intrascapular back pain with exertion and radiation to bilateral arms.  His symptoms were concerning for exertional angina.  His ECG demonstrated new lateral TWI.  With his CKD, I felt he was too high risk for CIN with cardiac cath.  I started long acting nitrates and obtained a Myoview.  This demonstrated prior inferior  scar but no ischemia and EF 44%.  There was essentially no change from his study done in 2015.  He returns for FU.  He is doing much better.  He denies any further back or arm pain with exertion.  Denies dyspnea, orthopnea, PND, edema.  No syncope.  He recently had  L eye retinal hemorrhage. He is afraid to remain on Xarelto and wants to change to Eliquis.    Studies/Reports Reviewed Today:  Myoview 05/10/14 Intermediate risk  Medium sized, severe in intensity fixed defect c/w prior infarct from the base to the apex with no evidence of ischemia. LV Ejection Fraction: 44%.  2 D Echocardiogram 07/30/13 - Mild focal basal hypertrophy of the septum. EF 50% to 55%. There is hypokinesis of the basal-midinferolateral and inferior myocardium. - Left atrium: The atrium was mildly dilated.   Past Medical History  Diagnosis Date  . CAD (coronary artery disease)     a. [place] 1991 with totally occluded RCA. No intervention;  b. Lexiscan Cardiolite in (3/15) showed EF 41%, moderate inferior infarction with minimal peri-infarct ischemia.; c. Myoview (2/16): EF 44%, inferior infarct, no ischemia, intermediate risk (med rx cont'd)   . Atrial fibrillation     Diagnosed in 2/15. TEE-guided cardioversion in 2/15 but went back into atrial fibrillation. Amiodarone begun and patient went back into NSR.   Marland Kitchen Cardiomyopathy     a. Suspect tachycardia-mediated >> TEE (2/15) with EF 30%, diffuse hypokinesis, moderate MR, mildly decreased RV systolic function;  EF 41% by Lexiscan Cardiolite in 3/15;  b.  Echo (5/15) with EF 50-55%, hypokinesis of the basal to mid inferolateral and inferior walls.   Marland Kitchen PVC (premature ventricular contraction)   . Kidney stones   . Hypertension   . Hyperlipidemia   . Hard of hearing     hearing aid right ear/hole in left eardrum  . Macular degeneration   . Sinus bradycardia   . Hx of cardiovascular stress test     Lexiscan Myoview (2/16): Inferior scar, no ischemia, EF 44%;  intermediate risk  . Colon polyps     a. Colo 2/16: polys in cecum, sigmoid and desc colon, cecal AVM, sigmoid diverticulosis, int hem's  . H/O endoscopy     a. EGD 2/16:  atrophic gastritis in gastric fundus and gastric body, nodule in gastric cardia (bx performed), o/w normal - Fe def anemia likely from blood loss due to cecal AVM and/or poor Fe absorption from atrophic gastritis     Past Surgical History  Procedure Laterality Date  . Cystoscopy w/ retrogrades  07/19/2003    Double-J stent placement  . Cystoscopy w/ retrogrades  08/08/2001    Double-J stent placement, Stone manipulation, Ureteroscopy  . Appendectomy  1952  . Tonsillectomy      as a child  . Cardiac catheterization  08/02/1989    EF - between 40 and 50%  . Tee without cardioversion N/A 05/04/2013    Procedure: TRANSESOPHAGEAL ECHOCARDIOGRAM (TEE);  Surgeon: Larey Dresser, MD;  Location: [facility];  Service: Cardiovascular;  Laterality: N/A;  . Cardioversion N/A 05/04/2013    Procedure: CARDIOVERSION;  Surgeon: Larey Dresser, MD;  Location: [facility];  Service: Cardiovascular;  Laterality: N/A;     Current Outpatient Prescriptions  Medication Sig Dispense Refill  . amiodarone (PACERONE) 200 MG tablet TAKE 1 TABLET TWICE A DAY FOR 1 WEEK THEN 1 TABLET DAILY 90 tablet 1  . amLODipine (NORVASC) 5 MG tablet Take 1 tablet (5 mg total) by mouth daily. 30 tablet 6  . Cyanocobalamin (B-12 COMPLIANCE INJECTION IJ) Inject as directed every 30 (thirty) days.    . Fe Fum-FA-B Cmp-C-Zn-Mg-Mn-Cu (HEMOCYTE PLUS) 106-1 MG CAPS Take 1 capsule by mouth daily.  3  . isosorbide mononitrate (IMDUR) 30 MG 24 hr tablet Take 1 tablet (30 mg total) by mouth daily. 30 tablet 11  . metoprolol succinate (TOPROL-XL) 25 MG 24 hr tablet Take 0.5 tablets (12.5 mg total) by mouth daily.    . nitroGLYCERIN (NITROSTAT) 0.4 MG SL tablet Place 1 tablet (0.4 mg total) under the tongue every 5 (five) minutes as needed for chest pain. 25 tablet 2    . Nutritional Supplements (PROSTA VITE PO) Take 1 tablet by mouth daily.    . pravastatin (PRAVACHOL) 80 MG tablet Take 80 mg by mouth daily.      . Rivaroxaban (XARELTO) 15 MG TABS tablet Take 1 tablet (15 mg total) by mouth daily with supper. 30 tablet 6   No current facility-administered medications for this visit.    Allergies:   Codeine    Social History:  The patient  reports that he quit smoking about 30 years ago. His smoking use included Cigarettes. He has never used smokeless tobacco. He reports that he does not drink alcohol or use illicit drugs.   Family History:  The patient's family history includes Breast cancer in his mother; Cancer in his mother. There is no history of Heart attack or Stroke.    ROS:  Please  see the history of present illness.   Review of Systems  Constitution: Negative for fever.  Eyes:       +retinal hemorrhage  Gastrointestinal: Negative for hematochezia and melena.  Genitourinary: Negative for hematuria.  All other systems reviewed and are negative.      PHYSICAL EXAM: VS:  BP 120/50 mmHg  Pulse 58  Ht 5\' 6"  (1.676 m)  Wt 179 lb (81.194 kg)  BMI 28.91 kg/m2    Wt Readings from Last 3 Encounters:  05/16/14 179 lb (81.194 kg)  05/09/14 178 lb (80.74 kg)  04/30/14 182 lb (82.555 kg)     GEN: Well nourished, well developed, in no acute distress HEENT: normal Neck: no JVD, no masses Cardiac:  Normal S1/S2, RRR; no murmur, no rubs or gallops, no edema  Respiratory:  clear to auscultation bilaterally, no wheezing, rhonchi or rales. GI: soft, nontender, nondistended, + BS MS: no deformity or atrophy Skin: warm and dry  Neuro:  CNs II-XII intact, Strength and sensation are intact Psych: Normal affect   EKG:  EKG is ordered today.  It demonstrates:   Sinus brady, HR 58, RBBB   Recent Labs: 03/07/2014: ALT 13 03/11/2014: TSH 2.315 04/30/2014: BUN 28*; Creatinine 2.31*; Hemoglobin 9.6*; Platelets 92.0 Repeated and verified X2.*;  Potassium 4.4; Sodium 141    Lipid Panel    Component Value Date/Time   CHOL 122 04/30/2013 1555   TRIG 63.0 04/30/2013 1555   HDL 34.50* 04/30/2013 1555   CHOLHDL 4 04/30/2013 1555   VLDL 12.6 04/30/2013 1555   LDLCALC 75 04/30/2013 1555      ASSESSMENT AND PLAN:  1.  Exertional Angina:    Symptoms are resolved on Nitrates.  He had a recent Myoview that demonstrates scar but no ischemia.  With CKD, recommend avoiding LHC unless emergent.  Continue medical management.  2.  Anemia:  Recent EGD/colo with cecal AVM and colon polyps.  Suspected Fe deficiency anemia due to bleeding from cecal AVM.  With his CKD, I suspect he also has anemia of chronic disease.  Recent Hgb stable.    3.  Atrial Fibrillation:  Maintaining NSR on Amiodarone.  Recent TSH and ALT ok.  He would like to change Xarelto to Eliquis.  He is concerned about the recent retinal hemorrhage he experienced.  We discussed the potential risk of bleeding with any anticoagulant.  But, changing to Eliquis is reasonable.      -  DC Xarleto.    -  Start Eliquis 2.5 mg Twice daily  4.  Coronary Artery Disease:  As noted, I suspect his symptoms are anginal.  He is not on ASA as he is on Xarelto.  Continue beta blocker, amlodipine, statin, nitrates.  Symptoms much improved on current rx.  Recent Myoview without significant change. 5.  Hypertension:  Controlled.  6.  Hyperlipidemia:  Continue statin Rx.  LDL optimal in 2/15.   7.  Chronic Kidney Disease, Stage IV:  Recent creatinine stable.  FU with nephrology as planned.     Current medicines are reviewed at length with the patient today.  The patient has concerns regarding medicines.  The following changes have been made:  Change Xarelto to Eliquis.    Labs/ tests ordered today include:  Orders Placed This Encounter  Procedures  . EKG 12-Lead    Disposition:   FU with Dr. Loralie Gillespie 8 weeks.    Signed, Versie Starks, MHS 05/16/2014 9:11 AM    [facility]  [place], [place], [place]  [address] Phone: [phone]; Fax: [phone]

## 2014-05-23 ENCOUNTER — Encounter (INDEPENDENT_AMBULATORY_CARE_PROVIDER_SITE_OTHER): Payer: Medicare Other | Admitting: Ophthalmology

## 2014-05-23 ENCOUNTER — Other Ambulatory Visit: Payer: Self-pay | Admitting: Cardiology

## 2014-06-03 ENCOUNTER — Telehealth: Payer: Self-pay

## 2014-06-03 DIAGNOSIS — I208 Other forms of angina pectoris: Secondary | ICD-10-CM

## 2014-06-03 MED ORDER — APIXABAN 2.5 MG PO TABS: 2.5000 mg | ORAL_TABLET | Freq: Two times a day (BID) | ORAL | Status: AC

## 2014-06-03 MED ORDER — ISOSORBIDE MONONITRATE ER 30 MG PO TB24: 15.0000 mg | ORAL_TABLET | Freq: Every day | ORAL | Status: DC

## 2014-06-03 NOTE — Telephone Encounter (Signed)
Patient came in as a walk-in. Patient complained of dizziness and lightheadedness when he takes Imdur. Patient denies any symptoms right now. Patient is also concerned about his prescription for Eliquis is not been filled.

## 2014-06-07 ENCOUNTER — Encounter (INDEPENDENT_AMBULATORY_CARE_PROVIDER_SITE_OTHER): Payer: Medicare Other | Admitting: Ophthalmology

## 2014-06-07 DIAGNOSIS — H35033 Hypertensive retinopathy, bilateral: Secondary | ICD-10-CM | POA: Diagnosis not present

## 2014-06-07 DIAGNOSIS — H43813 Vitreous degeneration, bilateral: Secondary | ICD-10-CM

## 2014-06-07 DIAGNOSIS — H4312 Vitreous hemorrhage, left eye: Secondary | ICD-10-CM | POA: Diagnosis not present

## 2014-06-07 DIAGNOSIS — H3531 Nonexudative age-related macular degeneration: Secondary | ICD-10-CM

## 2014-06-07 DIAGNOSIS — H3532 Exudative age-related macular degeneration: Secondary | ICD-10-CM | POA: Diagnosis not present

## 2014-06-07 DIAGNOSIS — I10 Essential (primary) hypertension: Secondary | ICD-10-CM | POA: Diagnosis not present

## 2014-07-05 ENCOUNTER — Encounter (INDEPENDENT_AMBULATORY_CARE_PROVIDER_SITE_OTHER): Payer: Medicare Other | Admitting: Ophthalmology

## 2014-07-05 DIAGNOSIS — H43813 Vitreous degeneration, bilateral: Secondary | ICD-10-CM

## 2014-07-05 DIAGNOSIS — H3531 Nonexudative age-related macular degeneration: Secondary | ICD-10-CM | POA: Diagnosis not present

## 2014-07-05 DIAGNOSIS — H4312 Vitreous hemorrhage, left eye: Secondary | ICD-10-CM

## 2014-07-05 DIAGNOSIS — H3532 Exudative age-related macular degeneration: Secondary | ICD-10-CM | POA: Diagnosis not present

## 2014-07-05 DIAGNOSIS — I10 Essential (primary) hypertension: Secondary | ICD-10-CM | POA: Diagnosis not present

## 2014-07-05 DIAGNOSIS — H35033 Hypertensive retinopathy, bilateral: Secondary | ICD-10-CM

## 2014-07-15 ENCOUNTER — Ambulatory Visit: Payer: Medicare Other | Admitting: Cardiology

## 2014-07-18 ENCOUNTER — Encounter: Payer: Self-pay | Admitting: Cardiology

## 2014-07-18 ENCOUNTER — Ambulatory Visit (INDEPENDENT_AMBULATORY_CARE_PROVIDER_SITE_OTHER): Payer: Medicare Other | Admitting: Cardiology

## 2014-07-18 VITALS — BP 146/56 | HR 60 | Ht 69.0 in | Wt 177.4 lb

## 2014-07-18 DIAGNOSIS — I480 Paroxysmal atrial fibrillation: Secondary | ICD-10-CM

## 2014-07-18 DIAGNOSIS — I10 Essential (primary) hypertension: Secondary | ICD-10-CM

## 2014-07-18 DIAGNOSIS — I2510 Atherosclerotic heart disease of native coronary artery without angina pectoris: Secondary | ICD-10-CM

## 2014-07-18 DIAGNOSIS — I5022 Chronic systolic (congestive) heart failure: Secondary | ICD-10-CM

## 2014-07-18 DIAGNOSIS — E7800 Pure hypercholesterolemia, unspecified: Secondary | ICD-10-CM

## 2014-07-18 LAB — BASIC METABOLIC PANEL
BUN: 30 mg/dL — AB (ref 6–23)
CALCIUM: 9.3 mg/dL (ref 8.4–10.5)
CO2: 29 mEq/L (ref 19–32)
CREATININE: 2.42 mg/dL — AB (ref 0.40–1.50)
Chloride: 107 mEq/L (ref 96–112)
GFR: 27.09 mL/min — ABNORMAL LOW (ref >60.00)
Glucose, Bld: 90 mg/dL (ref 70–99)
Potassium: 4.6 mEq/L (ref 3.5–5.1)
Sodium: 141 mEq/L (ref 135–145)

## 2014-07-18 LAB — CBC WITH DIFFERENTIAL/PLATELET
BASOS ABS: 0 10*3/uL (ref 0.0–0.1)
Basophils Relative: 0.5 % (ref 0.0–3.0)
EOS ABS: 0 10*3/uL (ref 0.0–0.7)
Eosinophils Relative: 0.9 % (ref 0.0–5.0)
HEMATOCRIT: 27.3 % — AB (ref 39.0–52.0)
Hemoglobin: 9.1 g/dL — ABNORMAL LOW (ref 13.0–17.0)
LYMPHS PCT: 56.7 % — AB (ref 12.0–46.0)
Lymphs Abs: 1.1 10*3/uL (ref 0.7–4.0)
MCHC: 33.4 g/dL (ref 30.0–36.0)
MCV: 97.3 fl (ref 78.0–100.0)
MONOS PCT: 8.5 % (ref 3.0–12.0)
Monocytes Absolute: 0.2 10*3/uL (ref 0.1–1.0)
NEUTROS ABS: 0.7 10*3/uL — AB (ref 1.4–7.7)
NEUTROS PCT: 33.4 % — AB (ref 43.0–77.0)
PLATELETS: 62 10*3/uL — AB (ref 150.0–400.0)
RBC: 2.8 Mil/uL — AB (ref 4.22–5.81)
RDW: 16.9 % — ABNORMAL HIGH (ref 11.5–15.5)
WBC: 2 10*3/uL — ABNORMAL LOW (ref 4.0–10.5)

## 2014-07-18 LAB — LIPID PANEL
Cholesterol: 111 mg/dL (ref 0–200)
HDL: 35 mg/dL — ABNORMAL LOW (ref >39.00)
LDL Cholesterol: 62 mg/dL (ref 0–99)
NONHDL: 76
Total CHOL/HDL Ratio: 3
Triglycerides: 72 mg/dL (ref 0.0–149.0)
VLDL: 14.4 mg/dL (ref 0.0–40.0)

## 2014-07-18 NOTE — Progress Notes (Signed)
Patient ID: Christian Gillespie, male   DOB: [birth year], 81 y.o.   MRN: [id number] PCP: Dr. Harrington Challenger  81 yo with history of CAD s/p cath in 1991 showing a totally occluded RCA with no intervention presents today for cardiology followup.  In 2/15, he was noted to be in atrial fibrillation with RVR.  He had been feeling "bad" for about 3 wks with exertional dyspnea.  I started him on Xarelto and Lasix and brought him in later in 2/15 for TEE-guided DCCV.  He converted to NSR but had a lot of PACs after the procedure.  I therefore started him on amiodarone at 200 mg bid.  TEE showed EF 30% with diffuse hypokinesis and moderate MR. At a subsequent appointment he was back in atrial fibrillation.  I increased amiodarone, and he went back into NSR without another electrical cardioversion. He is in NSR today. Lexiscan Cardiolite in 3/15 showed EF 41% with moderate inferior MI, no significant ischemia.  Repeat echo in 5/15 showed EF 50-55% with hypokinesis of the basal to mid inferolateral and inferior walls.  He had a Cardiolite again in 2/16 with EF 44%, fixed inferior defect, no ischemia (no change from 3/15 study).    He was seen by GI in 2/16 for anemia.  EGD and colonoscopy were done.  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  He had gastritis.    Mr Heckard remains in NSR.  No dyspnea walking on flat ground, and he can climb a flight of steps.  No chest pain.  He is not taking any Lasix and weight is down 2 lbs.   Of note, he has only been taking Eliquis once a day.  He has been having cramps in his arms and back and is concerned that this is being caused by pravastatin.   ECG: NSR, RBBB, PVC  Labs (10/14): creatinine 1.69 Labs (2/15): K 4.5, creatinine 2.2=>1.9, BNP 692=>946, LDL 75, HDL 35, TSH normal Labs (3/15): K 3.6, creatinine 2.4 Labs (5/15): K 4.5, creatinine 2.2, LFTs normal, TSH normal Labs (9/15): K 4.6, creatinine 2.56, TSH normal, LFTs normal, HCT 32.2 Labs (12/15): TSH  normal, LFTs normal Labs (3/16): K 4.5, creatinine 2.59, HCT 26.9  PMH: 1. HTN 2. Hyperlipidemia 3. CAD: [place] 1991 with totally occluded RCA.  No intervention. Lexiscan Cardiolite in (3/15) showed EF 41%, moderate inferior infarction with minimal peri-infarct ischemia. Lexiscan Cardiolite (2/16) showed EF 44%, fixed inferior defect, no ischemia.  4. Atrial fibrillation: Diagnosed in 2/15.  TEE-guided cardioversion in 2/15 but went back into atrial fibrillation.  Amiodarone begun and patient went back into NSR.  5. 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  TEE (2/15) with EF 30%, diffuse hypokinesis, moderate MR, mildly decreased RV systolic function.  EF 41% by Lexiscan Cardiolite in 3/15.  Echo (5/15) with EF 50-55%, hypokinesis of the basal to mid inferolateral and inferior walls.  6. Macular degeneration 7. Sinus bradycardia 8. Anemia: GI workup in 2/16 with EGD and colonoscopy.  This showed gastritis, cecal AVMs, and colonic polyps.  The polyps were removed.   SH: Quit smoking in 1987.  Lives in [place].  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   ROS: All systems reviewed and negative except as noted in HPI.   Current Outpatient Prescriptions  Medication Sig Dispense Refill  . amiodarone (PACERONE) 200 MG tablet TAKE 1 TABLET TWICE A DAY FOR 1 WEEK THEN 1 TABLET DAILY 90 tablet 1  .  amLODipine (NORVASC) 5 MG tablet Take 1 tablet (5 mg total) by mouth daily. 30 tablet 6  . apixaban (ELIQUIS) 2.5 MG TABS tablet Take 1 tablet (2.5 mg total) by mouth 2 (two) times daily. 60 tablet 11  . Cyanocobalamin (B-12 COMPLIANCE INJECTION IJ) Inject as directed every 30 (thirty) days.    . Fe Fum-FA-B Cmp-C-Zn-Mg-Mn-Cu (HEMOCYTE PLUS) 106-1 MG CAPS Take 1 capsule by mouth daily.  3  . metoprolol succinate (TOPROL-XL) 25 MG 24 hr tablet Take 0.5 tablets (12.5 mg total) by mouth daily.    . Nutritional Supplements (PROSTA VITE PO) Take 1 tablet by mouth daily.    .  pravastatin (PRAVACHOL) 80 MG tablet Take 80 mg by mouth daily.      . Coenzyme Q10 200 MG TABS Take 1 tablet (200 mg total) by mouth daily.    . nitroGLYCERIN (NITROSTAT) 0.4 MG SL tablet Place 1 tablet (0.4 mg total) under the tongue every 5 (five) minutes as needed for chest pain. (Patient not taking: Reported on 07/18/2014) 25 tablet 2   No current facility-administered medications for this visit.    BP 146/56 mmHg  Pulse 60  Ht 5\' 9"  (1.753 m)  Wt 177 lb 6.4 oz (80.468 kg)  BMI 26.19 kg/m2 General: NAD Neck:  No JVD, no thyromegaly or thyroid nodule.  Lungs: Clear to auscultation bilaterally with normal respiratory effort. CV: Nondisplaced PMI.  Heart brady, regular S1/S2, no S3/S4, 1/6 HSM apex.  No edema.  No carotid bruit.  Normal pedal pulses.  Abdomen: Soft, nontender, no hepatosplenomegaly, no distention.  Neurologic: Alert and oriented x 3.  Psych: Normal affect. Extremities: No clubbing or cyanosis.   Assessment/Plan: 1. Atrial fibrillation: Patient is now in NSR on amiodarone.   - Continue amiodarone but with bradycardia it has been decreased to 100 mg daily.  He needs LFTs and TSH today.  He has regular eye evaluation.  - He needs to increase Eliquis to 2.5 mg bid (rather than once daily).  CBC today.  2. Chronic systolic CHF: EF 37% with diffuse hypokinesis on TEE in 2/15, EF 41% on Cardiolite in 3/15. After being in NSR for a couple of months, EF was 50-55% by echo in 5/15. However, Cardiolite in 2/16 was similar to prior Cardiolite with EF 44%. Possible component of tachycardia-mediated cardiomyopathy when he was in atrial fibrillation.  Symptomatically doing well, NYHA class II.  He is not volume overloaded on exam. He is now off Lasix.  - No ACEI with elevated creatinine.  Toprol XL at low dose due to bradycardia. 3. CAD: Cardiolite in 2/16 showed prior inferior MI (known from past) without significant ischemia.  No ASA with stable CAD and on Eliquis.  Continue statin.   4. Hyperlipidemia: He has some arm and back cramping and is concerned that pravastatin is causing it.  I offered to transition him to Crestor, however he would rather continue a generic.  I will have him take coenzyme Q10 200 mg daily.  Check lipids today.  5. CKD: Creatinine stably elevated in 3/16.  Has nephrology followup.  6. HTN: BP mildly elevated, will not change meds today.   Followup in 6 months.    [name] 07/18/2014

## 2014-07-18 NOTE — Patient Instructions (Signed)
Medication Instructions:  Take coenzyme Q 10 200mg  daily for muscle aches.  BE SURE TO TAKE ELIQUIS TWO TIMES DAY.  Labwork: BMET/CBCD/Lipid profile today.  Testing/Procedures: None  Follow-Up: Your physician wants you to follow-up in: 6 months with Dr Aundra Dubin. (October 2016)You will receive a reminder letter in the mail two months in advance. If you don't receive a letter, please call our office to schedule the follow-up appointment.

## 2014-07-19 ENCOUNTER — Other Ambulatory Visit: Payer: Self-pay | Admitting: *Deleted

## 2014-07-19 DIAGNOSIS — D72819 Decreased white blood cell count, unspecified: Secondary | ICD-10-CM

## 2014-07-19 NOTE — Addendum Note (Signed)
Addended by: Katrine Coho on: 07/19/2014 07:58 AM   Modules accepted: Orders

## 2014-08-02 ENCOUNTER — Encounter (INDEPENDENT_AMBULATORY_CARE_PROVIDER_SITE_OTHER): Payer: Medicare Other | Admitting: Ophthalmology

## 2014-08-02 DIAGNOSIS — H3531 Nonexudative age-related macular degeneration: Secondary | ICD-10-CM | POA: Diagnosis not present

## 2014-08-02 DIAGNOSIS — H3532 Exudative age-related macular degeneration: Secondary | ICD-10-CM | POA: Diagnosis not present

## 2014-08-02 DIAGNOSIS — I10 Essential (primary) hypertension: Secondary | ICD-10-CM

## 2014-08-02 DIAGNOSIS — H43813 Vitreous degeneration, bilateral: Secondary | ICD-10-CM | POA: Diagnosis not present

## 2014-08-02 DIAGNOSIS — H35033 Hypertensive retinopathy, bilateral: Secondary | ICD-10-CM | POA: Diagnosis not present

## 2014-08-06 ENCOUNTER — Telehealth: Payer: Self-pay | Admitting: Hematology & Oncology

## 2014-08-06 NOTE — Telephone Encounter (Signed)
I spoke w NEW PATIENT today to remind them of their appointment with Dr. Ennever. Also, advised them to bring all medication bottles and insurance card information. ° °

## 2014-08-07 ENCOUNTER — Encounter: Payer: Self-pay | Admitting: Hematology & Oncology

## 2014-08-07 ENCOUNTER — Ambulatory Visit: Payer: Medicare Other

## 2014-08-07 ENCOUNTER — Ambulatory Visit (HOSPITAL_BASED_OUTPATIENT_CLINIC_OR_DEPARTMENT_OTHER): Payer: Medicare Other | Admitting: Hematology & Oncology

## 2014-08-07 ENCOUNTER — Other Ambulatory Visit (HOSPITAL_BASED_OUTPATIENT_CLINIC_OR_DEPARTMENT_OTHER): Payer: Medicare Other

## 2014-08-07 VITALS — BP 139/52 | HR 62 | Temp 97.8°F | Resp 22 | Ht 66.0 in | Wt 178.0 lb

## 2014-08-07 DIAGNOSIS — I4891 Unspecified atrial fibrillation: Secondary | ICD-10-CM | POA: Diagnosis not present

## 2014-08-07 DIAGNOSIS — I2510 Atherosclerotic heart disease of native coronary artery without angina pectoris: Secondary | ICD-10-CM | POA: Diagnosis not present

## 2014-08-07 DIAGNOSIS — D696 Thrombocytopenia, unspecified: Secondary | ICD-10-CM

## 2014-08-07 DIAGNOSIS — D61818 Other pancytopenia: Secondary | ICD-10-CM

## 2014-08-07 LAB — CBC WITH DIFFERENTIAL (CANCER CENTER ONLY)
BASO#: 0 10*3/uL (ref 0.0–0.2)
BASO%: 0.5 % (ref 0.0–2.0)
EOS%: 0.5 % (ref 0.0–7.0)
Eosinophils Absolute: 0 10*3/uL (ref 0.0–0.5)
HEMATOCRIT: 25.3 % — AB (ref 38.7–49.9)
HEMOGLOBIN: 8.1 g/dL — AB (ref 13.0–17.1)
LYMPH#: 1.1 10*3/uL (ref 0.9–3.3)
LYMPH%: 54.7 % — ABNORMAL HIGH (ref 14.0–48.0)
MCH: 33.5 pg — ABNORMAL HIGH (ref 28.0–33.4)
MCHC: 32 g/dL (ref 32.0–35.9)
MCV: 105 fL — ABNORMAL HIGH (ref 82–98)
MONO#: 0.3 10*3/uL (ref 0.1–0.9)
MONO%: 13 % (ref 0.0–13.0)
NEUT%: 31.3 % — AB (ref 40.0–80.0)
NEUTROS ABS: 0.6 10*3/uL — AB (ref 1.5–6.5)
Platelets: 68 10*3/uL — ABNORMAL LOW (ref 145–400)
RBC: 2.42 10*6/uL — ABNORMAL LOW (ref 4.20–5.70)
RDW: 15.2 % (ref 11.1–15.7)
WBC: 1.9 10*3/uL — AB (ref 4.0–10.0)

## 2014-08-07 LAB — CHCC SATELLITE - SMEAR

## 2014-08-07 NOTE — Progress Notes (Signed)
Referral MD  Reason for Referral: Pancytopenia   Chief Complaint  Patient presents with  . NEW PATIENT  : My blood counts are low.  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  He has a history of atrial fibrillation. He is on ELIQUIS. He does have some cardiomyopathy.  He has been followed by cardiology. He has had lab work done. He was found to have some thrombocytopenia. Because of this, it was felt that he needed to be seen in hematology for evaluation.  Going through the lab work that we have on him that was sent to our office, he had labs done back in December 2015. This showed a white cell count of 3.7. Hemoglobin 9.2. Plantar count 126. MCV was 95. He had a normal white cell differential.  3 months ago, in February, his CBC showed white count 2.8. Hemoglobin 9.6. Hematocrit 28.5. Platelet count was 92,000. MCV was 96. He had a normal white cell differential.  Most recently in April, his white cell count was 2.0. Hemoglobin 9.1. Platelet count was 62,000. He had MCV of 97. His white cell differential was not different with 33% segs, 57% lymphocytes, 9% monocytes.  He does not smoke or drink area he's had no change in his medication. He recently was put on iron and vitamin B-12. I told him to stop these.  He's had no weight loss or weight gain. He's had no rashes. He's had some increased bruising with but no bleeding.  He's had occasional leg swelling.  His last colonoscopy was probably about 5 years ago.  He's had no palpable lymph glands.  His appetite has been good.  Overall, his performance status is ECOG 1.   Past Medical History  Diagnosis Date  . CAD (coronary artery disease)     a. [place] 1991 with totally occluded RCA. No intervention;  b. Lexiscan Cardiolite in (3/15) showed EF 41%, moderate inferior infarction with minimal peri-infarct ischemia.; c. Myoview (2/16): EF 44%, inferior infarct, no ischemia,  intermediate risk (med rx cont'd)   . Atrial fibrillation     Diagnosed in 2/15. TEE-guided cardioversion in 2/15 but went back into atrial fibrillation. Amiodarone begun and patient went back into NSR.   Marland Kitchen Cardiomyopathy     a. Suspect tachycardia-mediated >> TEE (2/15) with EF 30%, diffuse hypokinesis, moderate MR, mildly decreased RV systolic function; EF 93% by Lexiscan Cardiolite in 3/15;  b.  Echo (5/15) with EF 50-55%, hypokinesis of the basal to mid inferolateral and inferior walls.   Marland Kitchen PVC (premature ventricular contraction)   . Kidney stones   . Hypertension   . Hyperlipidemia   . Hard of hearing     hearing aid right ear/hole in left eardrum  . Macular degeneration   . Sinus bradycardia   . Hx of cardiovascular stress test     Lexiscan Myoview (2/16): Inferior scar, no ischemia, EF 44%; intermediate risk  . Colon polyps     a. Colo 2/16: polys in cecum, sigmoid and desc colon, cecal AVM, sigmoid diverticulosis, int hem's  . H/O endoscopy     a. EGD 2/16:  atrophic gastritis in gastric fundus and gastric body, nodule in gastric cardia (bx performed), o/w normal - Fe def anemia likely from blood loss due to cecal AVM and/or poor Fe absorption from atrophic gastritis   :  Past Surgical History  Procedure Laterality Date  . Cystoscopy w/ retrogrades  07/19/2003  Double-J stent placement  . Cystoscopy w/ retrogrades  08/08/2001    Double-J stent placement, Stone manipulation, Ureteroscopy  . Appendectomy  1952  . Tonsillectomy      as a child  . Cardiac catheterization  08/02/1989    EF - between 40 and 50%  . Tee without cardioversion N/A 05/04/2013    Procedure: TRANSESOPHAGEAL ECHOCARDIOGRAM (TEE);  Surgeon: Larey Dresser, MD;  Location: [facility];  Service: Cardiovascular;  Laterality: N/A;  . Cardioversion N/A 05/04/2013    Procedure: CARDIOVERSION;  Surgeon: Larey Dresser, MD;  Location: [facility] ENDOSCOPY;  Service: Cardiovascular;  Laterality: N/A;   :   Current outpatient prescriptions:  .  amiodarone (PACERONE) 200 MG tablet, TAKE 1 TABLET TWICE A DAY FOR 1 WEEK THEN 1 TABLET DAILY, Disp: 90 tablet, Rfl: 1 .  amLODipine (NORVASC) 5 MG tablet, Take 1 tablet (5 mg total) by mouth daily., Disp: 30 tablet, Rfl: 6 .  apixaban (ELIQUIS) 2.5 MG TABS tablet, Take 1 tablet (2.5 mg total) by mouth 2 (two) times daily., Disp: 60 tablet, Rfl: 11 .  Coenzyme Q10 200 MG TABS, Take 1 tablet (200 mg total) by mouth daily., Disp: , Rfl:  .  metoprolol succinate (TOPROL-XL) 25 MG 24 hr tablet, Take 0.5 tablets (12.5 mg total) by mouth daily., Disp: , Rfl:  .  nitroGLYCERIN (NITROSTAT) 0.4 MG SL tablet, Place 1 tablet (0.4 mg total) under the tongue every 5 (five) minutes as needed for chest pain., Disp: 25 tablet, Rfl: 2 .  Nutritional Supplements (PROSTA VITE PO), Take 1 tablet by mouth daily., Disp: , Rfl:  .  pravastatin (PRAVACHOL) 80 MG tablet, Take 80 mg by mouth daily.  , Disp: , Rfl: :  :  Allergies  Allergen Reactions  . Codeine Other (See Comments)    dizzy  . Imdur [Isosorbide]     dizzy  :  Family History  Problem Relation Age of Onset  . Breast cancer Mother   . Heart attack Neg Hx   . Stroke Neg Hx   . Cancer Mother   :  History   Social History  . Marital Status: Married    Spouse Name: N/A  . Number of Children: N/A  . Years of Education: N/A   Occupational History  . Not on file.   Social History Main Topics  . Smoking status: Former Smoker    Types: Cigarettes    Quit date: 04/09/1984  . Smokeless tobacco: Never Used     Comment: quit 26yeras ago  . Alcohol Use: No  . Drug Use: No  . Sexual Activity: Not on file   Other Topics Concern  . Not on file   Social History Narrative  :  Pertinent items are noted in HPI.  Exam: @IPVITALS @  elderly but well-developed white gentleman in no obvious distress. Vital signs show temperature of 97.8. Pulse 62. Blood pressure 139/52. Weight is 178 pounds. Head and  neck exam shows no ocular or oral lesions. There are no palpable cervical or supraclavicular lymph nodes. He has no scleral icterus. Thyroid is not palpable. Lungs are clear bilaterally. Cardiac exam regular rate and rhythm with no murmurs, rubs or bruits. Abdomen is soft. He has good bowel sounds. There is no fluid wave. He has no guarding or rebound tenderness. There is no palpable hepatomegaly. Spleen tip might be palpable with deep inspiration. Back exam shows no tenderness over the spine, ribs or hips. Extremities shows no clubbing, cyanosis or edema. He  has good range of motion of his joints. He has no joint swelling, erythema or warmth. Skin exam shows some scattered ecchymoses. Neurological exam shows no focal neurological deficits.    Recent Labs  08/07/14 1207  WBC 1.9*  HGB 8.1*  HCT 25.3*  PLT 68*   No results for input(s): NA, K, CL, CO2, GLUCOSE, BUN, CREATININE, CALCIUM in the last 72 hours.  Blood smear review: Normochromic and normocytic population of red blood cells. He has no schistocytes or spherocytes. There is no nuclear red cells. He has no teardrop cells. There is no target cells. He has no inclusion bodies. There is no rouleau formation. White cells appear normal in morphology maturation. He may have a couple atypical lymphocytes. I do not see any hyper segmented polys. He has no immature myeloid cells. There is no blasts. Platelets are decreased in number. He has several large platelets. Platelets are well granulated.  Pathology: None     Assessment and Plan: Mr. Chisolm is a very nice 81 year old gentleman. He has pancytopenia. I have to believe that he is going to have myelodysplasia. I really do not see anything that looked specific on his blood smear for an underlying bone marrow disorder such as acute leukemia. I suppose the one option could be hairy cell leukemia. His spleen might be enlarged. As such, one might have to think about hairy cell leukemia. I think hairy  cell leukemia would be the best thing for him as this would be the most treatable.  Despite his age, he does have a good performance status. He looks pretty fit. 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  I do think that a bone marrow test would be helpful in this situation. I think that we have to do a bone marrow test so that we can see exactly what is going on and what degree of myelodysplasia he likely has. We can get cytogenetic studies on the bone marrow.  I spent about 60 minutes with he and his wife. I answered all their questions. I explained why thought that a bone marrow test would be helpful. He agrees. We will set this up through done in the office on May 24. I think this would be a very reasonable way of trying to evaluate him.   I will then plan to get him back in about a week or so. We will then figure out how we can try to help him.  I sent off an erythropoietin level on him. I did send off a vitamin B-12 level. We set of iron studies. I think all these can help.

## 2014-08-08 LAB — IRON AND TIBC CHCC
%SAT: 32 % (ref 20–55)
Iron: 87 ug/dL (ref 42–163)
TIBC: 276 ug/dL (ref 202–409)
UIBC: 189 ug/dL (ref 117–376)

## 2014-08-08 LAB — FERRITIN CHCC: Ferritin: 34 ng/ml (ref 22–316)

## 2014-08-09 LAB — ERYTHROPOIETIN: Erythropoietin: 33.6 m[IU]/mL — ABNORMAL HIGH (ref 2.6–18.5)

## 2014-08-09 LAB — PROTEIN ELECTROPHORESIS, SERUM, WITH REFLEX
ALPHA-1-GLOBULIN: 0.3 g/dL (ref 0.2–0.3)
Albumin ELP: 4.1 g/dL (ref 3.8–4.8)
Alpha-2-Globulin: 0.5 g/dL (ref 0.5–0.9)
Beta 2: 0.4 g/dL (ref 0.2–0.5)
Beta Globulin: 0.4 g/dL (ref 0.4–0.6)
Gamma Globulin: 0.9 g/dL (ref 0.8–1.7)
Total Protein, Serum Electrophoresis: 6.6 g/dL (ref 6.1–8.1)

## 2014-08-09 LAB — COMPREHENSIVE METABOLIC PANEL
ALBUMIN: 4.1 g/dL (ref 3.5–5.2)
ALT: 10 U/L (ref 0–53)
AST: 18 U/L (ref 0–37)
Alkaline Phosphatase: 53 U/L (ref 39–117)
BUN: 32 mg/dL — AB (ref 6–23)
CHLORIDE: 107 meq/L (ref 96–112)
CO2: 26 meq/L (ref 19–32)
Calcium: 9 mg/dL (ref 8.4–10.5)
Creatinine, Ser: 2.36 mg/dL — ABNORMAL HIGH (ref 0.50–1.35)
Glucose, Bld: 90 mg/dL (ref 70–99)
Potassium: 4.4 mEq/L (ref 3.5–5.3)
Sodium: 141 mEq/L (ref 135–145)
Total Bilirubin: 0.5 mg/dL (ref 0.2–1.2)
Total Protein: 6.6 g/dL (ref 6.0–8.3)

## 2014-08-09 LAB — RETICULOCYTES (CHCC)
ABS RETIC: 61.2 10*3/uL (ref 19.0–186.0)
RBC.: 2.55 MIL/uL — AB (ref 4.22–5.81)
Retic Ct Pct: 2.4 % — ABNORMAL HIGH (ref 0.4–2.3)

## 2014-08-09 LAB — VITAMIN B12: VITAMIN B 12: 518 pg/mL (ref 211–911)

## 2014-08-09 LAB — LACTATE DEHYDROGENASE: LDH: 171 U/L (ref 94–250)

## 2014-08-12 ENCOUNTER — Inpatient Hospital Stay (HOSPITAL_COMMUNITY): Payer: Medicare Other

## 2014-08-12 ENCOUNTER — Encounter (HOSPITAL_COMMUNITY): Payer: Self-pay | Admitting: Emergency Medicine

## 2014-08-12 ENCOUNTER — Other Ambulatory Visit (HOSPITAL_COMMUNITY): Payer: Self-pay

## 2014-08-12 ENCOUNTER — Encounter (HOSPITAL_COMMUNITY): Admission: EM | Disposition: E | Payer: Medicare Other | Source: Home / Self Care | Attending: Pulmonary Disease

## 2014-08-12 ENCOUNTER — Emergency Department (HOSPITAL_COMMUNITY): Payer: Medicare Other

## 2014-08-12 DIAGNOSIS — R195 Other fecal abnormalities: Secondary | ICD-10-CM | POA: Insufficient documentation

## 2014-08-12 DIAGNOSIS — I429 Cardiomyopathy, unspecified: Secondary | ICD-10-CM | POA: Diagnosis present

## 2014-08-12 DIAGNOSIS — I209 Angina pectoris, unspecified: Secondary | ICD-10-CM | POA: Diagnosis not present

## 2014-08-12 DIAGNOSIS — R739 Hyperglycemia, unspecified: Secondary | ICD-10-CM | POA: Diagnosis present

## 2014-08-12 DIAGNOSIS — I5022 Chronic systolic (congestive) heart failure: Secondary | ICD-10-CM

## 2014-08-12 DIAGNOSIS — Z888 Allergy status to other drugs, medicaments and biological substances status: Secondary | ICD-10-CM

## 2014-08-12 DIAGNOSIS — I469 Cardiac arrest, cause unspecified: Secondary | ICD-10-CM | POA: Diagnosis not present

## 2014-08-12 DIAGNOSIS — E861 Hypovolemia: Secondary | ICD-10-CM | POA: Diagnosis present

## 2014-08-12 DIAGNOSIS — D696 Thrombocytopenia, unspecified: Secondary | ICD-10-CM

## 2014-08-12 DIAGNOSIS — N170 Acute kidney failure with tubular necrosis: Secondary | ICD-10-CM | POA: Diagnosis present

## 2014-08-12 DIAGNOSIS — Z885 Allergy status to narcotic agent status: Secondary | ICD-10-CM | POA: Diagnosis not present

## 2014-08-12 DIAGNOSIS — E860 Dehydration: Secondary | ICD-10-CM | POA: Diagnosis present

## 2014-08-12 DIAGNOSIS — K5790 Diverticulosis of intestine, part unspecified, without perforation or abscess without bleeding: Secondary | ICD-10-CM | POA: Diagnosis present

## 2014-08-12 DIAGNOSIS — D649 Anemia, unspecified: Secondary | ICD-10-CM | POA: Diagnosis not present

## 2014-08-12 DIAGNOSIS — R001 Bradycardia, unspecified: Secondary | ICD-10-CM

## 2014-08-12 DIAGNOSIS — I129 Hypertensive chronic kidney disease with stage 1 through stage 4 chronic kidney disease, or unspecified chronic kidney disease: Secondary | ICD-10-CM | POA: Diagnosis present

## 2014-08-12 DIAGNOSIS — I214 Non-ST elevation (NSTEMI) myocardial infarction: Secondary | ICD-10-CM | POA: Diagnosis present

## 2014-08-12 DIAGNOSIS — E872 Acidosis, unspecified: Secondary | ICD-10-CM

## 2014-08-12 DIAGNOSIS — I5042 Chronic combined systolic (congestive) and diastolic (congestive) heart failure: Secondary | ICD-10-CM

## 2014-08-12 DIAGNOSIS — H9190 Unspecified hearing loss, unspecified ear: Secondary | ICD-10-CM | POA: Diagnosis present

## 2014-08-12 DIAGNOSIS — J9601 Acute respiratory failure with hypoxia: Secondary | ICD-10-CM

## 2014-08-12 DIAGNOSIS — I25119 Atherosclerotic heart disease of native coronary artery with unspecified angina pectoris: Secondary | ICD-10-CM | POA: Diagnosis present

## 2014-08-12 DIAGNOSIS — K921 Melena: Secondary | ICD-10-CM | POA: Diagnosis present

## 2014-08-12 DIAGNOSIS — E875 Hyperkalemia: Secondary | ICD-10-CM | POA: Diagnosis present

## 2014-08-12 DIAGNOSIS — N179 Acute kidney failure, unspecified: Secondary | ICD-10-CM | POA: Diagnosis not present

## 2014-08-12 DIAGNOSIS — Z7901 Long term (current) use of anticoagulants: Secondary | ICD-10-CM | POA: Diagnosis not present

## 2014-08-12 DIAGNOSIS — I959 Hypotension, unspecified: Secondary | ICD-10-CM | POA: Insufficient documentation

## 2014-08-12 DIAGNOSIS — I480 Paroxysmal atrial fibrillation: Secondary | ICD-10-CM

## 2014-08-12 DIAGNOSIS — I2510 Atherosclerotic heart disease of native coronary artery without angina pectoris: Secondary | ICD-10-CM

## 2014-08-12 DIAGNOSIS — Z66 Do not resuscitate: Secondary | ICD-10-CM | POA: Diagnosis not present

## 2014-08-12 DIAGNOSIS — R579 Shock, unspecified: Secondary | ICD-10-CM

## 2014-08-12 DIAGNOSIS — I340 Nonrheumatic mitral (valve) insufficiency: Secondary | ICD-10-CM | POA: Diagnosis present

## 2014-08-12 DIAGNOSIS — E7800 Pure hypercholesterolemia, unspecified: Secondary | ICD-10-CM | POA: Diagnosis present

## 2014-08-12 DIAGNOSIS — E669 Obesity, unspecified: Secondary | ICD-10-CM | POA: Diagnosis present

## 2014-08-12 DIAGNOSIS — I248 Other forms of acute ischemic heart disease: Secondary | ICD-10-CM | POA: Diagnosis present

## 2014-08-12 DIAGNOSIS — I10 Essential (primary) hypertension: Secondary | ICD-10-CM | POA: Diagnosis present

## 2014-08-12 DIAGNOSIS — R079 Chest pain, unspecified: Secondary | ICD-10-CM

## 2014-08-12 DIAGNOSIS — I208 Other forms of angina pectoris: Secondary | ICD-10-CM

## 2014-08-12 DIAGNOSIS — D61818 Other pancytopenia: Secondary | ICD-10-CM

## 2014-08-12 DIAGNOSIS — N184 Chronic kidney disease, stage 4 (severe): Secondary | ICD-10-CM | POA: Diagnosis present

## 2014-08-12 DIAGNOSIS — I5043 Acute on chronic combined systolic (congestive) and diastolic (congestive) heart failure: Secondary | ICD-10-CM | POA: Diagnosis present

## 2014-08-12 DIAGNOSIS — H3530 Unspecified macular degeneration: Secondary | ICD-10-CM | POA: Diagnosis present

## 2014-08-12 DIAGNOSIS — I454 Nonspecific intraventricular block: Secondary | ICD-10-CM | POA: Diagnosis not present

## 2014-08-12 DIAGNOSIS — I4510 Unspecified right bundle-branch block: Secondary | ICD-10-CM | POA: Diagnosis present

## 2014-08-12 DIAGNOSIS — N289 Disorder of kidney and ureter, unspecified: Secondary | ICD-10-CM

## 2014-08-12 DIAGNOSIS — R0600 Dyspnea, unspecified: Secondary | ICD-10-CM

## 2014-08-12 DIAGNOSIS — Z6827 Body mass index (BMI) 27.0-27.9, adult: Secondary | ICD-10-CM | POA: Diagnosis not present

## 2014-08-12 DIAGNOSIS — R570 Cardiogenic shock: Secondary | ICD-10-CM | POA: Diagnosis not present

## 2014-08-12 DIAGNOSIS — E785 Hyperlipidemia, unspecified: Secondary | ICD-10-CM | POA: Diagnosis present

## 2014-08-12 DIAGNOSIS — I2582 Chronic total occlusion of coronary artery: Secondary | ICD-10-CM

## 2014-08-12 DIAGNOSIS — Q2733 Arteriovenous malformation of digestive system vessel: Secondary | ICD-10-CM | POA: Diagnosis not present

## 2014-08-12 DIAGNOSIS — I2541 Coronary artery aneurysm: Secondary | ICD-10-CM | POA: Diagnosis present

## 2014-08-12 DIAGNOSIS — Z87891 Personal history of nicotine dependence: Secondary | ICD-10-CM | POA: Diagnosis not present

## 2014-08-12 DIAGNOSIS — N189 Chronic kidney disease, unspecified: Secondary | ICD-10-CM

## 2014-08-12 DIAGNOSIS — D500 Iron deficiency anemia secondary to blood loss (chronic): Secondary | ICD-10-CM

## 2014-08-12 DIAGNOSIS — Z0189 Encounter for other specified special examinations: Secondary | ICD-10-CM

## 2014-08-12 DIAGNOSIS — Z4659 Encounter for fitting and adjustment of other gastrointestinal appliance and device: Secondary | ICD-10-CM

## 2014-08-12 DIAGNOSIS — R4182 Altered mental status, unspecified: Secondary | ICD-10-CM

## 2014-08-12 HISTORY — DX: Chronic total occlusion of coronary artery: I25.82

## 2014-08-12 HISTORY — DX: Paroxysmal atrial fibrillation: I48.0

## 2014-08-12 HISTORY — PX: CARDIAC CATHETERIZATION: SHX172

## 2014-08-12 LAB — CBC
HCT: 31.8 % — ABNORMAL LOW (ref 39.0–52.0)
HCT: 29.6 % — ABNORMAL LOW (ref 39.0–52.0)
HEMATOCRIT: 22.6 % — AB (ref 39.0–52.0)
HEMOGLOBIN: 7.2 g/dL — AB (ref 13.0–17.0)
HEMOGLOBIN: 9.8 g/dL — AB (ref 13.0–17.0)
HEMOGLOBIN: 9.2 g/dL — AB (ref 13.0–17.0)
MCH: 32.6 pg (ref 26.0–34.0)
MCH: 29.9 pg (ref 26.0–34.0)
MCH: 30.9 pg (ref 26.0–34.0)
MCHC: 31.9 g/dL (ref 30.0–36.0)
MCHC: 30.8 g/dL (ref 30.0–36.0)
MCHC: 31.1 g/dL (ref 30.0–36.0)
MCV: 102.3 fL — AB (ref 78.0–100.0)
MCV: 97 fL (ref 78.0–100.0)
MCV: 99.3 fL (ref 78.0–100.0)
PLATELETS: 64 10*3/uL — AB (ref 150–400)
Platelets: 91 10*3/uL — ABNORMAL LOW (ref 150–400)
Platelets: 74 10*3/uL — ABNORMAL LOW (ref 150–400)
RBC: 2.21 MIL/uL — AB (ref 4.22–5.81)
RBC: 3.28 MIL/uL — AB (ref 4.22–5.81)
RBC: 2.98 MIL/uL — ABNORMAL LOW (ref 4.22–5.81)
RDW: 16.7 % — ABNORMAL HIGH (ref 11.5–15.5)
RDW: 22.5 % — ABNORMAL HIGH (ref 11.5–15.5)
RDW: 23.1 % — ABNORMAL HIGH (ref 11.5–15.5)
WBC: 3.9 10*3/uL — ABNORMAL LOW (ref 4.0–10.5)
WBC: 3.9 10*3/uL — ABNORMAL LOW (ref 4.0–10.5)
WBC: 8.7 10*3/uL (ref 4.0–10.5)

## 2014-08-12 LAB — POCT I-STAT 3, ART BLOOD GAS (G3+)
Acid-base deficit: 23 mmol/L — ABNORMAL HIGH (ref 0.0–2.0)
Bicarbonate: 9.3 mEq/L — ABNORMAL LOW (ref 20.0–24.0)
O2 SAT: 81 %
PO2 ART: 72 mmHg — AB (ref 80.0–100.0)
Patient temperature: 96
TCO2: 11 mmol/L (ref 0–100)
pCO2 arterial: 49.4 mmHg — ABNORMAL HIGH (ref 35.0–45.0)
pH, Arterial: 6.871 — CL (ref 7.350–7.450)

## 2014-08-12 LAB — PROTIME-INR
INR: 1.52 — ABNORMAL HIGH (ref 0.00–1.49)
PROTHROMBIN TIME: 18.3 s — AB (ref 11.6–15.2)

## 2014-08-12 LAB — CREATININE, URINE, RANDOM: Creatinine, Urine: 287.32 mg/dL

## 2014-08-12 LAB — URINALYSIS, ROUTINE W REFLEX MICROSCOPIC
Bilirubin Urine: NEGATIVE
Glucose, UA: NEGATIVE mg/dL
HGB URINE DIPSTICK: NEGATIVE
Ketones, ur: 15 mg/dL — AB
Leukocytes, UA: NEGATIVE
NITRITE: NEGATIVE
Protein, ur: 100 mg/dL — AB
Specific Gravity, Urine: 1.027 (ref 1.005–1.030)
UROBILINOGEN UA: 1 mg/dL (ref 0.0–1.0)
pH: 5 (ref 5.0–8.0)

## 2014-08-12 LAB — CBC WITH DIFFERENTIAL/PLATELET
Basophils Absolute: 0 10*3/uL (ref 0.0–0.1)
Basophils Relative: 0 % (ref 0–1)
EOS ABS: 0 10*3/uL (ref 0.0–0.7)
Eosinophils Relative: 0 % (ref 0–5)
HCT: 29.3 % — ABNORMAL LOW (ref 39.0–52.0)
Hemoglobin: 9.2 g/dL — ABNORMAL LOW (ref 13.0–17.0)
Lymphocytes Relative: 19 % (ref 12–46)
Lymphs Abs: 0.6 10*3/uL — ABNORMAL LOW (ref 0.7–4.0)
MCH: 30 pg (ref 26.0–34.0)
MCHC: 31.4 g/dL (ref 30.0–36.0)
MCV: 95.4 fL (ref 78.0–100.0)
Monocytes Absolute: 0.4 10*3/uL (ref 0.1–1.0)
Monocytes Relative: 11 % (ref 3–12)
NEUTROS ABS: 2.2 10*3/uL (ref 1.7–7.7)
Neutrophils Relative %: 70 % (ref 43–77)
Platelets: 79 10*3/uL — ABNORMAL LOW (ref 150–400)
RBC: 3.07 MIL/uL — ABNORMAL LOW (ref 4.22–5.81)
RDW: 21.4 % — ABNORMAL HIGH (ref 11.5–15.5)
WBC: 3.2 10*3/uL — AB (ref 4.0–10.5)

## 2014-08-12 LAB — COMPREHENSIVE METABOLIC PANEL
ALT: 13 U/L — ABNORMAL LOW (ref 17–63)
AST: 28 U/L (ref 15–41)
Albumin: 3.9 g/dL (ref 3.5–5.0)
Alkaline Phosphatase: 43 U/L (ref 38–126)
Anion gap: 16 — ABNORMAL HIGH (ref 5–15)
BILIRUBIN TOTAL: 1 mg/dL (ref 0.3–1.2)
BUN: 49 mg/dL — ABNORMAL HIGH (ref 6–20)
CALCIUM: 9.2 mg/dL (ref 8.9–10.3)
CO2: 18 mmol/L — ABNORMAL LOW (ref 22–32)
CREATININE: 3.56 mg/dL — AB (ref 0.61–1.24)
Chloride: 107 mmol/L (ref 101–111)
GFR calc Af Amer: 16 mL/min — ABNORMAL LOW (ref >60)
GFR calc non Af Amer: 14 mL/min — ABNORMAL LOW (ref >60)
Glucose, Bld: 186 mg/dL — ABNORMAL HIGH (ref 65–99)
Potassium: 4.2 mmol/L (ref 3.5–5.1)
Sodium: 141 mmol/L (ref 135–145)
Total Protein: 6.7 g/dL (ref 6.5–8.1)

## 2014-08-12 LAB — BASIC METABOLIC PANEL
Anion gap: 24 — ABNORMAL HIGH (ref 5–15)
BUN: 57 mg/dL — ABNORMAL HIGH (ref 6–20)
CO2: 10 mmol/L — AB (ref 22–32)
Calcium: 8.6 mg/dL — ABNORMAL LOW (ref 8.9–10.3)
Chloride: 106 mmol/L (ref 101–111)
Creatinine, Ser: 4.93 mg/dL — ABNORMAL HIGH (ref 0.61–1.24)
GFR calc Af Amer: 11 mL/min — ABNORMAL LOW (ref >60)
GFR calc non Af Amer: 10 mL/min — ABNORMAL LOW (ref >60)
Glucose, Bld: 269 mg/dL — ABNORMAL HIGH (ref 65–99)
Potassium: 5.7 mmol/L — ABNORMAL HIGH (ref 3.5–5.1)
Sodium: 140 mmol/L (ref 135–145)

## 2014-08-12 LAB — SODIUM, URINE, RANDOM: Sodium, Ur: 27 mmol/L

## 2014-08-12 LAB — TROPONIN I
TROPONIN I: 17.03 ng/mL — AB (ref <0.031)
TROPONIN I: 27.52 ng/mL — AB (ref <0.031)
TROPONIN I: 50.08 ng/mL — AB (ref <0.031)
Troponin I: 0.69 ng/mL (ref <0.031)

## 2014-08-12 LAB — POCT I-STAT, CHEM 8
BUN: 62 mg/dL — ABNORMAL HIGH (ref 6–20)
CALCIUM ION: 1.45 mmol/L — AB (ref 1.13–1.30)
CHLORIDE: 113 mmol/L — AB (ref 101–111)
Creatinine, Ser: 4.2 mg/dL — ABNORMAL HIGH (ref 0.61–1.24)
GLUCOSE: 207 mg/dL — AB (ref 65–99)
HCT: 29 % — ABNORMAL LOW (ref 39.0–52.0)
HEMOGLOBIN: 9.9 g/dL — AB (ref 13.0–17.0)
POTASSIUM: 6 mmol/L — AB (ref 3.5–5.1)
Sodium: 142 mmol/L (ref 135–145)
TCO2: 9 mmol/L (ref 0–100)

## 2014-08-12 LAB — PROCALCITONIN: Procalcitonin: 0.13 ng/mL

## 2014-08-12 LAB — PREPARE RBC (CROSSMATCH)

## 2014-08-12 LAB — BRAIN NATRIURETIC PEPTIDE: B Natriuretic Peptide: 1000.2 pg/mL — ABNORMAL HIGH (ref 0.0–100.0)

## 2014-08-12 LAB — APTT: aPTT: 23 seconds — ABNORMAL LOW (ref 24–37)

## 2014-08-12 LAB — TSH: TSH: 5.239 u[IU]/mL — ABNORMAL HIGH (ref 0.350–4.500)

## 2014-08-12 LAB — MAGNESIUM: Magnesium: 3 mg/dL — ABNORMAL HIGH (ref 1.7–2.4)

## 2014-08-12 LAB — I-STAT TROPONIN, ED: Troponin i, poc: 0.23 ng/mL (ref 0.00–0.08)

## 2014-08-12 LAB — MRSA PCR SCREENING: MRSA by PCR: NEGATIVE

## 2014-08-12 LAB — ABO/RH: ABO/RH(D): A POS

## 2014-08-12 LAB — LACTIC ACID, PLASMA
LACTIC ACID, VENOUS: 11.4 mmol/L — AB (ref 0.5–2.0)
Lactic Acid, Venous: 5.5 mmol/L (ref 0.5–2.0)

## 2014-08-12 LAB — URINE MICROSCOPIC-ADD ON

## 2014-08-12 LAB — POC OCCULT BLOOD, ED: FECAL OCCULT BLD: POSITIVE — AB

## 2014-08-12 LAB — GLUCOSE, CAPILLARY: Glucose-Capillary: 182 mg/dL — ABNORMAL HIGH (ref 65–99)

## 2014-08-12 SURGERY — TEMPORARY PACEMAKER INSERTION

## 2014-08-12 MED ORDER — SODIUM CHLORIDE 0.9 % IV SOLN
1.0000 mg/h | INTRAVENOUS | Status: DC
  Filled 2014-08-12: qty 10

## 2014-08-12 MED ORDER — CETYLPYRIDINIUM CHLORIDE 0.05 % MT LIQD
7.0000 mL | Freq: Four times a day (QID) | OROMUCOSAL | Status: DC
  Administered 2014-08-13: 7 mL via OROMUCOSAL

## 2014-08-12 MED ORDER — MIDAZOLAM HCL 2 MG/2ML IJ SOLN
INTRAMUSCULAR | Status: AC
  Filled 2014-08-12: qty 2

## 2014-08-12 MED ORDER — ONDANSETRON HCL 4 MG/2ML IJ SOLN
4.0000 mg | Freq: Four times a day (QID) | INTRAMUSCULAR | Status: DC | PRN
  Filled 2014-08-12: qty 2

## 2014-08-12 MED ORDER — ACETAMINOPHEN 325 MG PO TABS: 650.0000 mg | ORAL_TABLET | ORAL | Status: DC | PRN

## 2014-08-12 MED ORDER — SODIUM CHLORIDE 0.9 % IV SOLN: 250.0000 mL | INTRAVENOUS | Status: DC | PRN

## 2014-08-12 MED ORDER — AMIODARONE HCL IN DEXTROSE 360-4.14 MG/200ML-% IV SOLN: 60.0000 mg/h | INTRAVENOUS | Status: DC

## 2014-08-12 MED ORDER — ASPIRIN 300 MG RE SUPP: 300.0000 mg | RECTAL | Status: DC

## 2014-08-12 MED ORDER — FUROSEMIDE 10 MG/ML IJ SOLN: 40.0000 mg | Freq: Two times a day (BID) | INTRAMUSCULAR | Status: DC

## 2014-08-12 MED ORDER — SODIUM CHLORIDE 0.9 % IJ SOLN
3.0000 mL | Freq: Two times a day (BID) | INTRAMUSCULAR | Status: DC
  Administered 2014-08-13: 3 mL via INTRAVENOUS

## 2014-08-12 MED ORDER — PHENYLEPHRINE HCL 10 MG/ML IJ SOLN
30.0000 ug/min | INTRAVENOUS | Status: DC
  Administered 2014-08-12: 125 ug/min via INTRAVENOUS
  Filled 2014-08-12: qty 1

## 2014-08-12 MED ORDER — PANTOPRAZOLE SODIUM 40 MG PO TBEC: 40.0000 mg | DELAYED_RELEASE_TABLET | Freq: Every day | ORAL | Status: DC

## 2014-08-12 MED ORDER — NITROGLYCERIN IN D5W 200-5 MCG/ML-% IV SOLN
0.0000 ug/min | Freq: Once | INTRAVENOUS | Status: AC
  Administered 2014-08-12: 5 ug/min via INTRAVENOUS
  Filled 2014-08-12: qty 250

## 2014-08-12 MED ORDER — IOHEXOL 350 MG/ML SOLN
INTRAVENOUS | Status: DC | PRN
  Administered 2014-08-12: 125 mL via INTRA_ARTERIAL

## 2014-08-12 MED ORDER — DEXTROSE 5 % IV SOLN
0.0000 ug/min | INTRAVENOUS | Status: DC
  Administered 2014-08-12: 30 ug/min via INTRAVENOUS
  Administered 2014-08-12: 10 ug/min via INTRAVENOUS
  Administered 2014-08-12: 42 ug/min via INTRAVENOUS
  Filled 2014-08-12 (×3): qty 4

## 2014-08-12 MED ORDER — FENTANYL CITRATE (PF) 100 MCG/2ML IJ SOLN: 50.0000 ug | INTRAMUSCULAR | Status: DC | PRN

## 2014-08-12 MED ORDER — SODIUM CHLORIDE 0.9 % IV SOLN
INTRAVENOUS | Status: DC
  Administered 2014-08-12: 12:00:00 via INTRAVENOUS

## 2014-08-12 MED ORDER — FENTANYL CITRATE (PF) 100 MCG/2ML IJ SOLN
50.0000 ug | INTRAMUSCULAR | Status: DC | PRN
  Administered 2014-08-12: 100 ug via INTRAVENOUS

## 2014-08-12 MED ORDER — PANTOPRAZOLE SODIUM 40 MG IV SOLR
40.0000 mg | INTRAVENOUS | Status: DC
  Administered 2014-08-12: 40 mg via INTRAVENOUS

## 2014-08-12 MED ORDER — FENTANYL CITRATE (PF) 100 MCG/2ML IJ SOLN
INTRAMUSCULAR | Status: AC
  Filled 2014-08-12: qty 4

## 2014-08-12 MED ORDER — METOPROLOL TARTRATE 1 MG/ML IV SOLN
INTRAVENOUS | Status: AC
  Administered 2014-08-12: 2.5 mg
  Filled 2014-08-12: qty 5

## 2014-08-12 MED ORDER — MIDAZOLAM HCL 2 MG/2ML IJ SOLN
INTRAMUSCULAR | Status: AC
  Filled 2014-08-12: qty 4

## 2014-08-12 MED ORDER — ONDANSETRON HCL 4 MG/2ML IJ SOLN: 4.0000 mg | Freq: Four times a day (QID) | INTRAMUSCULAR | Status: DC | PRN

## 2014-08-12 MED ORDER — RANOLAZINE ER 500 MG PO TB12
500.0000 mg | ORAL_TABLET | Freq: Two times a day (BID) | ORAL | Status: DC
  Filled 2014-08-12 (×2): qty 1

## 2014-08-12 MED ORDER — MORPHINE SULFATE 4 MG/ML IJ SOLN
4.0000 mg | Freq: Once | INTRAMUSCULAR | Status: AC
  Administered 2014-08-12: 4 mg via INTRAVENOUS
  Filled 2014-08-12: qty 1

## 2014-08-12 MED ORDER — ASPIRIN 81 MG PO CHEW: 324.0000 mg | CHEWABLE_TABLET | ORAL | Status: DC

## 2014-08-12 MED ORDER — MORPHINE SULFATE 2 MG/ML IJ SOLN
1.0000 mg | INTRAMUSCULAR | Status: DC | PRN
  Administered 2014-08-12: 1 mg via INTRAVENOUS
  Filled 2014-08-12 (×2): qty 1

## 2014-08-12 MED ORDER — MORPHINE SULFATE 2 MG/ML IJ SOLN
1.0000 mg | INTRAMUSCULAR | Status: AC
  Administered 2014-08-12: 1 mg via INTRAVENOUS
  Filled 2014-08-12: qty 1

## 2014-08-12 MED ORDER — MIDAZOLAM HCL 2 MG/2ML IJ SOLN
INTRAMUSCULAR | Status: DC | PRN
  Administered 2014-08-12: 5 mg via INTRAVENOUS

## 2014-08-12 MED ORDER — FUROSEMIDE 10 MG/ML IJ SOLN
INTRAMUSCULAR | Status: AC
  Filled 2014-08-12: qty 8

## 2014-08-12 MED ORDER — SODIUM CHLORIDE 0.9 % IV SOLN
10.0000 ug/h | INTRAVENOUS | Status: AC
  Administered 2014-08-12: 50 ug/h via INTRAVENOUS
  Filled 2014-08-12: qty 50

## 2014-08-12 MED ORDER — SODIUM POLYSTYRENE SULFONATE 15 GM/60ML PO SUSP
30.0000 g | Freq: Once | ORAL | Status: DC
  Filled 2014-08-12: qty 120

## 2014-08-12 MED ORDER — LIDOCAINE HCL (PF) 1 % IJ SOLN
INTRAMUSCULAR | Status: AC
  Filled 2014-08-12: qty 30

## 2014-08-12 MED ORDER — METOPROLOL TARTRATE 1 MG/ML IV SOLN
2.5000 mg | INTRAVENOUS | Status: AC
  Administered 2014-08-12: 2.5 mg via INTRAVENOUS

## 2014-08-12 MED ORDER — SODIUM CHLORIDE 0.9 % IJ SOLN
3.0000 mL | INTRAMUSCULAR | Status: DC | PRN
  Administered 2014-08-13: 3 mL via INTRAVENOUS
  Filled 2014-08-12: qty 3

## 2014-08-12 MED ORDER — AMIODARONE HCL IN DEXTROSE 360-4.14 MG/200ML-% IV SOLN
INTRAVENOUS | Status: AC
  Filled 2014-08-12: qty 200

## 2014-08-12 MED ORDER — AMIODARONE HCL 200 MG PO TABS
200.0000 mg | ORAL_TABLET | Freq: Once | ORAL | Status: DC
  Filled 2014-08-12 (×2): qty 1

## 2014-08-12 MED ORDER — PANTOPRAZOLE SODIUM 40 MG IV SOLR
40.0000 mg | Freq: Two times a day (BID) | INTRAVENOUS | Status: DC
  Administered 2014-08-12: 40 mg via INTRAVENOUS
  Filled 2014-08-12: qty 40

## 2014-08-12 MED ORDER — ATROPINE SULFATE 0.1 MG/ML IJ SOLN
INTRAMUSCULAR | Status: AC
  Filled 2014-08-12: qty 10

## 2014-08-12 MED ORDER — NITROGLYCERIN 0.4 MG SL SUBL: 0.4000 mg | SUBLINGUAL_TABLET | SUBLINGUAL | Status: DC | PRN

## 2014-08-12 MED ORDER — FENTANYL CITRATE (PF) 100 MCG/2ML IJ SOLN
INTRAMUSCULAR | Status: DC | PRN
  Administered 2014-08-12: 50 ug via INTRAVENOUS

## 2014-08-12 MED ORDER — CHLORHEXIDINE GLUCONATE 0.12 % MT SOLN
15.0000 mL | Freq: Two times a day (BID) | OROMUCOSAL | Status: DC
  Administered 2014-08-13: 15 mL via OROMUCOSAL

## 2014-08-12 MED ORDER — ONDANSETRON HCL 4 MG/2ML IJ SOLN
4.0000 mg | Freq: Once | INTRAMUSCULAR | Status: AC
  Administered 2014-08-12: 4 mg via INTRAVENOUS
  Filled 2014-08-12: qty 2

## 2014-08-12 MED ORDER — AMIODARONE HCL 100 MG PO TABS
100.0000 mg | ORAL_TABLET | Freq: Every day | ORAL | Status: DC
Start: 2014-08-13 — End: 2014-08-12

## 2014-08-12 MED ORDER — ATORVASTATIN CALCIUM 80 MG PO TABS
80.0000 mg | ORAL_TABLET | Freq: Every day | ORAL | Status: DC
  Filled 2014-08-12 (×2): qty 1

## 2014-08-12 MED ORDER — SODIUM CHLORIDE 0.9 % IV SOLN
INTRAVENOUS | Status: DC
  Administered 2014-08-12: 06:00:00 via INTRAVENOUS

## 2014-08-12 MED ORDER — SODIUM CHLORIDE 0.9 % WEIGHT BASED INFUSION
1.0000 mL/kg/h | INTRAVENOUS | Status: AC
  Administered 2014-08-12: 1 mL/kg/h via INTRAVENOUS

## 2014-08-12 MED ORDER — AMIODARONE HCL IN DEXTROSE 360-4.14 MG/200ML-% IV SOLN
30.0000 mg/h | INTRAVENOUS | Status: DC
Start: 2014-08-12 — End: 2014-08-12

## 2014-08-12 MED ORDER — HEPARIN (PORCINE) IN NACL 2-0.9 UNIT/ML-% IJ SOLN
INTRAMUSCULAR | Status: AC
  Filled 2014-08-12: qty 1000

## 2014-08-12 MED ORDER — FUROSEMIDE 10 MG/ML IJ SOLN
80.0000 mg | INTRAMUSCULAR | Status: DC
  Administered 2014-08-12: 80 mg via INTRAVENOUS

## 2014-08-12 MED ORDER — NOREPINEPHRINE BITARTRATE 1 MG/ML IV SOLN
0.0000 ug/min | INTRAVENOUS | Status: DC
  Filled 2014-08-12: qty 16

## 2014-08-12 MED ORDER — NITROGLYCERIN IN D5W 200-5 MCG/ML-% IV SOLN: 0.0000 ug/min | INTRAVENOUS | Status: DC

## 2014-08-12 MED ORDER — SODIUM CHLORIDE 0.9 % IV SOLN
Freq: Once | INTRAVENOUS | Status: AC
  Administered 2014-08-12: 09:00:00 via INTRAVENOUS

## 2014-08-12 MED ORDER — LIDOCAINE HCL (PF) 1 % IJ SOLN
INTRAMUSCULAR | Status: DC | PRN
  Administered 2014-08-12: 15 mL via SUBCUTANEOUS

## 2014-08-12 MED ORDER — SODIUM BICARBONATE 8.4 % IV SOLN
INTRAVENOUS | Status: DC
  Administered 2014-08-12 (×2): via INTRAVENOUS
  Filled 2014-08-12 (×3): qty 150

## 2014-08-12 MED ORDER — ASPIRIN 81 MG PO CHEW
324.0000 mg | CHEWABLE_TABLET | Freq: Once | ORAL | Status: AC
  Administered 2014-08-12: 324 mg via ORAL
  Filled 2014-08-12: qty 4

## 2014-08-12 SURGICAL SUPPLY — 16 items
CATH INFINITI 5FR ANG PIGTAIL (CATHETERS) ×2 IMPLANT
CATH INFINITI 5FR MULTPACK ANG (CATHETERS) ×2 IMPLANT
CATH OPTITORQUE TIG 4.0 5F (CATHETERS) ×2 IMPLANT
CATH S G BIP PACING (SET/KITS/TRAYS/PACK) ×2 IMPLANT
DEVICE RAD COMP TR BAND LRG (VASCULAR PRODUCTS) ×2 IMPLANT
GLIDESHEATH SLEND A-KIT 6F 22G (SHEATH) ×2 IMPLANT
KIT HEART LEFT (KITS) ×4 IMPLANT
PACK CARDIAC CATHETERIZATION (CUSTOM PROCEDURE TRAY) ×4 IMPLANT
SHEATH PINNACLE 6F 10CM (SHEATH) ×4 IMPLANT
SLEEVE REPOSITIONING LENGTH 30 (MISCELLANEOUS) ×2 IMPLANT
SYR MEDRAD MARK V 150ML (SYRINGE) ×4 IMPLANT
TRANSDUCER W/STOPCOCK (MISCELLANEOUS) ×4 IMPLANT
TUBING CIL FLEX 10 FLL-RA (TUBING) ×4 IMPLANT
WIRE EMERALD 3MM-J .035X150CM (WIRE) ×2 IMPLANT
WIRE HI TORQ VERSACORE-J 145CM (WIRE) ×2 IMPLANT
WIRE SAFE-T 1.5MM-J .035X260CM (WIRE) ×2 IMPLANT

## 2014-08-12 NOTE — Progress Notes (Addendum)
I was asked to come back by to see the patient - now admitted to MICU due to hypotension & abnormal Echo.  Hgb with appropriate post-transfusion increase to >9, but continues to have active CP & low BP - intolerant of IV NTG.  HR has improved - but more broad /wide complex (likely aberrantly conducted Afb).    He has significant JVD, with what looks like at least moderate if not moderate to severe MR on echocardiogram with dilated left ventricle and reduced EF to roughly 25-30% and diffuse hypokinesis/dyskinesis. There remains expected inferior akinesis from the prior infarct.   This remains a very difficult situation and the patient to has just had his last dose of Eliquis last night, has acute on chronic renal insufficiency with creatinine up to 3.56 from 2.36 baseline. Despite this he has a mild troponin elevation and ongoing chest pain. I still think this is probably more consistent with demand ischemia. I've discussed it with 3 interventional cardiologist including myself and we all 4 along with his primary cardiologist Dr. Aundra Dubin agree that cardiac catheterization would not be a good option in this gentleman who is acutely decompensated heart failure with acute on chronic renal insufficiency and likely slow GI bleed.  After initially seeing the patient with Dr. [name] at bedside, I went to evaluate the echocardiogram and have now come back to the patient's bedside with Dr. Aundra Dubin. The following recommendations are according to our discussion  Principal Problem:   Acute on chronic combined systolic and diastolic congestive heart failure, NYHA class 3 Active Problems:   PAF (paroxysmal atrial fibrillation)   Acute on Chronic Anemia   Demand ischemia of myocardium   Acute on chronic kidney failure   Anginal chest pain at rest   Moderate to Severe mitral regurgitation; functional   Chronic total occlusion of native Right coronary artery   Hypercholesterolemia   Essential  hypertension   Chronic combined systolic and diastolic CHF (congestive heart failure)   Pancytopenia   Recommendations:  place an IJ central line with a Cordis sheath that could allow measurement of CVP and Co-ox - to estimate cardiac output, yet still allow for the potential bedside right heart catheterization.  Would convert from oral amiodarone to IV amiodarone for improved rate control/partial rhythm control. Would run without bolus. (I have ordered)  When necessary narcotics for chest pain  If chest pain not relieved and blood pressure will tolerate following the initiation of IV amiodarone, could start low-dose nitroglycerin (have written order for nitroglycerin and when to start)  Will start with IV Lasix 80 mg 1 followed by 40 IV twice a day starting tonight  He will be followed by Dr. Aundra Dubin with the heart failure service starting tomorrow as he now has likely new cardiomyopathy which could very well be related to tachycardia and anemia. Cannot exclude borderline Takotsubo etiology either.    Positive troponin levels may very well be related to the acute on chronic heart failure. His baseline echocardiogram from a year ago estimated 50-55%, but Dr. Aundra Dubin thinks this was an over read as both nuclear stress tests in the last year have estimated EF close to 40-45%.  Would not recommend invasive coronary evaluation unless there is a dramatic troponin elevation +/- signifiacnt hemodynamic instability.  Continue to monitor hemoglobin levels, and transfuse to keep hemoglobin over 9   We have updated the family and the patient is quite sick with new onset heart failure that is worse than previous. We are concerned  about his ongoing angina, however he does not pose as a good candidate for invasive procedures.  Total critical care time including reading bedside echocardiogram and discussing with Dr. Aundra Dubin - 1 hour   HARDING, Leonie Green, M.D., M.S. Interventional Cardiologist   Pager  # [phone]

## 2014-08-12 NOTE — ED Notes (Signed)
Shown to dr.knapp i stat troponin 0.23

## 2014-08-12 NOTE — Procedures (Signed)
Central Venous Catheter Insertion Procedure Note RODRIGO MCGRANAHAN [id number] [birth year]  Procedure: Insertion of Cordis Catheter Indications: Assessment of intravascular volume, Drug and/or fluid administration and Frequent blood sampling  Procedure Details Consent: Risks of procedure as well as the alternatives and risks of each were explained to the (patient/caregiver).  Consent for procedure obtained. Time Out: Verified patient identification, verified procedure, site/side was marked, verified correct patient position, special equipment/implants available, medications/allergies/relevent history reviewed, required imaging and test results available.  Performed  Maximum sterile technique was used including antiseptics, cap, gloves, gown, hand hygiene, mask and sheet. Skin prep: Chlorhexidine; local anesthetic administered A antimicrobial bonded/coated single lumen catheter was placed in the right internal jugular vein using the Seldinger technique.  Evaluation Blood flow good Complications: No apparent complications Patient did tolerate procedure well. Chest X-ray ordered to verify placement.  CXR: pending.  Procedure performed under direct ultrasound guidance for real time vessel cannulation.      Montey Hora, [facility] Pager: [phone]  or [phone] 07/27/2014, 6:51 PM

## 2014-08-12 NOTE — ED Notes (Signed)
Dr Harding with cardiology at bedside. 

## 2014-08-12 NOTE — Progress Notes (Addendum)
Looks better but Tachycardic, BP labile, chest pain still present but better w/ morphine. Hgb has improved and is now where he was 3 weeks ago.   Plan Will try low dose IV BB to see if rate control will help w/ some of his CP Keep even volume status at this point Trend CBC Repeat lactic acid (expect this was d/t his anemia) and hypovolemia. NOT infection Accept SBP of > 90 Consider [place] ACNP-BC [place] Pager # [phone] OR # [phone] if no answer   STAFF NOTE: I, Merrie Roof, MD FACP have personally reviewed patient's available data, including medical history, events of note, physical examination and test results as part of my evaluation. I have discussed with resident/NP and other care providers such as pharmacist, RN and RRT. In addition, I personally evaluated patient and elicited key findings of: Asked to revisit with MR Wieczorek since admission to icu. BP has been labile but not on pressors as of yet, O2 needs improved, awake and alert, no distress but with continued chest pain, 7/10, lungs crackles mild, concern is ischemia, ECG wide complex bundle, I decided to do limited echo, see separate report, concern is new drop in EF 25%, apex hypokinetic, unable to use contrast agent to better define, RV kinetic, LV dilated, appearing more now of ischemia concerns, may need line, d/w cards on phone and bedside, considering IV hep and close observation GI loss, as Rv appeared good overall, can reconsider nitrates IV if BP tolerates, cards considering cath even with renal fxn noted, repeat LActic awaited, may consider PA cath vs line pending cards decision.Gi evaluation on going in terms of risk anticoagulation, family updated in room extensive, very concerned about his continued chest pain, cards assessment crucial and much appreciated all there attention The patient is critically ill with multiple organ systems failure and requires high complexity decision  making for assessment and support, frequent evaluation and titration of therapies, application of advanced monitoring technologies and extensive interpretation of multiple databases.   Critical Care Time devoted to patient care services described in this note is 50 Minutes. This time reflects time of care of this signee: Merrie Roof, MD FACP. This critical care time does not reflect procedure time, or teaching time or supervisory time of PA/NP/Med student/Med Resident etc but could involve care discussion time. Rest per NP/medical resident whose note is outlined above and that I agree with   Lavon Paganini. [name], MD, [place] Pgr: [facility] 08/16/2014 9:39 PM

## 2014-08-12 NOTE — ED Notes (Signed)
Per Kary Kos NP, hold off on additional RBC unit, get CBC and see what HgB is and then the decision will be made to give RBC unit or not.

## 2014-08-12 NOTE — ED Notes (Signed)
Pt bp systolic remaining <435, per MD Darl Householder give 500 cc bolus.

## 2014-08-12 NOTE — ED Notes (Signed)
CRITICAL VALUE ALERT  Critical value received:  Troponin .85  Date of notification: 08/14/2014  Time of notification:  0845  Critical value read back:yes  Nurse who received alert: Renne Crigler RN

## 2014-08-12 NOTE — Procedures (Signed)
Procedure Limited echo 230 pm Indication: r/o ischemia, source labile BP  No contrast  1. EF 25% best estimate as base seems more kinetic as apex 2. Dilated LV 3. RV wnl, kinetic 4. No sig pericardial effusion 5. Apical hypokinesis and dyskinesia  Christian Gillespie. [name], MD, [place] P[facility]: [facility]

## 2014-08-12 NOTE — ED Notes (Signed)
Admitting at bedside 

## 2014-08-12 NOTE — ED Notes (Signed)
PA- with GI at bedside.  

## 2014-08-12 NOTE — Progress Notes (Signed)
Notified both respiratory and E-link doctor of patient's decreasing 02 saturation.  Respiratory tried increasing PEEP, bag levage, and recruitment procedures.  Pulse ox was moved to multiple sites.  Blood gas ran with pH 7.06, pC02 30.7, p02 61.0, bicarb at 8.7, and oxygen saturation of 80%.  No orders received.

## 2014-08-12 NOTE — Consult Note (Signed)
PULMONARY / CRITICAL CARE MEDICINE   Name: DAILYN KEMPNER MRN: [id number] DOB: 1926/02/12    ADMISSION DATE:  08/20/2014 CONSULTATION DATE:  5/23  REFERRING MD :  Sheran Fava   CHIEF COMPLAINT:  Chest pain and hypotension   INITIAL PRESENTATION:  81 year old male w/ sig h/o CAD, AF on Eliquis, cecal AVMs/ sigmoid diverticular disease,chronic pancytopenia, w/ prior FOB +. Presents to ER 5/23 w/ working dx of GIB (hgb drift from from 9.1 to 7.2 in 3 wks)  prob AVMs, w/ resultant demand ischemia/NSTEMI. PCCM asked to see for hypotension.   STUDIES:  ECHO 07/30/13: EF 50-55% hypokinesis of the basal-midinferolateral and inferior myocardium.  SIGNIFICANT EVENTS:    HISTORY OF PRESENT ILLNESS:   81 year old male w/ sig h/o CAD, AF on Eliquis, cecal AVMs/ sigmoid diverticular disease,chronic pancytopenia, w/ prior FOB +. Presents to ER 5/23 w/ 1 day h/o band-like chest pain w/ radiation to both arms. He describes the pain as constant pressure, worse w/ activity and better w/ rest. He denied sick exposure, cough, fever, chills, HA, light-headedness, palpitations, abd pain, N/V, diarrhea. On evaluation in ER found to have: FOB +, Trop I 0.69, hgb of 7.2, was 9.1 3 weeks prior and 8.1 1 week prior. In ER he was seen by cardiology and GI. Ordered for PPI, stopping Eliquis and transfusion. He had RBB changes on ECG. Working dx was GIB w/ resultant demand ischemia. Had been on NTG gtt but this had to be discontinued d/t hypotension. He was given a crystalloid 500 ml bolus, followed by his first unit of blood, then second unit transfused rapidly. PCCM asked to see as pt remained hypotensive into his second unit of blood.   PAST MEDICAL HISTORY :   has a past medical history of Coronary artery chronic total occlusion; Paroxysmal a-fib; Cardiomyopathy; PVC (premature ventricular contraction); Kidney stones; Hypertension; Hyperlipidemia; Hard of hearing; Macular degeneration; Sinus bradycardia; Colon polyps; and H/O  endoscopy.  has past surgical history that includes Cystoscopy w/ retrogrades (07/19/2003); Cystoscopy w/ retrogrades (08/08/2001); Appendectomy (1952); Tonsillectomy; Cardiac catheterization (08/02/1989); TEE without cardioversion (N/A, 05/04/2013); and Cardioversion (N/A, 05/04/2013). Prior to Admission medications   Medication Sig Start Date End Date Taking? Authorizing Provider  acetaminophen (TYLENOL) 500 MG tablet Take 500 mg by mouth every 6 (six) hours as needed for moderate pain.   Yes Historical Provider, MD  amiodarone (PACERONE) 200 MG tablet TAKE 1 TABLET TWICE A DAY FOR 1 WEEK THEN 1 TABLET DAILY Patient taking differently: Take 0.5 tablet daily 04/11/14  Yes Larey Dresser, MD  amLODipine (NORVASC) 5 MG tablet Take 1 tablet (5 mg total) by mouth daily. 08/09/13  Yes Larey Dresser, MD  apixaban (ELIQUIS) 2.5 MG TABS tablet Take 1 tablet (2.5 mg total) by mouth 2 (two) times daily. 06/03/14  Yes Scott Joylene Draft, PA-C  Coenzyme Q10 200 MG TABS Take 1 tablet (200 mg total) by mouth daily. 07/18/14  Yes Larey Dresser, MD  isosorbide mononitrate (IMDUR) 30 MG 24 hr tablet Take 30 mg by mouth once.   Yes Historical Provider, MD  metoprolol succinate (TOPROL-XL) 25 MG 24 hr tablet Take 0.5 tablets (12.5 mg total) by mouth daily. Patient taking differently: Take 25 mg by mouth daily.  11/23/13  Yes Larey Dresser, MD  nitroGLYCERIN (NITROSTAT) 0.4 MG SL tablet Place 1 tablet (0.4 mg total) under the tongue every 5 (five) minutes as needed for chest pain. 05/02/14  Yes Liliane Shi, PA-C  Nutritional  Supplements (PROSTA VITE PO) Take 1 tablet by mouth daily.   Yes Historical Provider, MD  pravastatin (PRAVACHOL) 80 MG tablet Take 80 mg by mouth daily.     Yes Historical Provider, MD   Allergies  Allergen Reactions  . Codeine Other (See Comments)    dizzy  . Imdur [Isosorbide]     dizzy    FAMILY HISTORY:  indicated that his mother is deceased. He indicated that his father is deceased. He  indicated that all of his four sisters are deceased. He indicated that both of his brothers are deceased. He indicated that his son is alive.  SOCIAL HISTORY:  reports that he quit smoking about 30 years ago. His smoking use included Cigarettes. He has never used smokeless tobacco. He reports that he does not drink alcohol or use illicit drugs.  REVIEW OF SYSTEMS:   GEN: + general discomfort. HENT: no HA, sore throat, nasal discomfort. Pulm: + exertional SOB, no cough, no wheeze. Card: + bad-like CP, worse w/ activity, better w/ rest. EXT: no edema. Abd: no abd pain, no nausea, no vomiting. See above re: other ROS   SUBJECTIVE:  Still w/ marked CP  VITAL SIGNS: Temp:  [97.3 F (36.3 C)] 97.3 F (36.3 C) (05/23 1113) Pulse Rate:  [96-108] 100 (05/23 1130) Resp:  [9-20] 16 (05/23 1130) BP: (80-145)/(45-65) 100/62 mmHg (05/23 1130) SpO2:  [92 %-100 %] 96 % (05/23 1130) Weight:  [77.111 kg (170 lb)] 77.111 kg (170 lb) (05/23 0553) HEMODYNAMICS:   VENTILATOR SETTINGS:   INTAKE / OUTPUT:  Intake/Output Summary (Last 24 hours) at 07/28/2014 1137 Last data filed at 08/02/2014 1113  Gross per 24 hour  Intake    335 ml  Output      0 ml  Net    335 ml    PHYSICAL EXAMINATION: General: 81 year old male, currently remarkably uncomfortable at rest  Neuro:  Awake, alert, no focal def HEENT: [place], + JVD, MMM,  Cardiovascular:  rrr Lungs:  Clear  Abdomen:  Soft, non-tender + bowel sounds  Musculoskeletal:  Intact  Skin:  Intact   LABS:  CBC  Recent Labs Lab 08/07/14 1207 07/29/2014 0600  WBC 1.9* 3.9*  HGB 8.1* 7.2*  HCT 25.3* 22.6*  PLT 68* 91*   Coag's  Recent Labs Lab 08/02/2014 0600  APTT 23*  INR 1.52*   BMET  Recent Labs Lab 08/07/14 1208 08/03/2014 0600  NA 141 141  K 4.4 4.2  CL 107 107  CO2 26 18*  BUN 32* 49*  CREATININE 2.36* 3.56*  GLUCOSE 90 186*   Electrolytes  Recent Labs Lab 08/07/14 1208 07/30/2014 0600  CALCIUM 9.0 9.2   Sepsis Markers No  results for input(s): LATICACIDVEN, PROCALCITON, O2SATVEN in the last 168 hours. ABG No results for input(s): PHART, PCO2ART, PO2ART in the last 168 hours. Liver Enzymes  Recent Labs Lab 08/07/14 1208 08/19/2014 0600  AST 18 28  ALT 10 13*  ALKPHOS 53 43  BILITOT 0.5 1.0  ALBUMIN 4.1 3.9   Cardiac Enzymes  Recent Labs Lab 08/12/14 0731  TROPONINI 0.69*   Glucose No results for input(s): GLUCAP in the last 168 hours.  Imaging No results found.   ASSESSMENT / PLAN:  PULMONARY OETT A: Dyspnea in setting of CP  P:   Supplemental oxygen  Wean FIO2 as needed   CARDIOVASCULAR CVL A:  NSTEMI/ demand ischemia  Chest pain  H/o AF on Eliquis  Hypotension  P:  Admit to ICU  F/u lactic acid  Transfuse w/ f/u CBC goal at least > 8 Morphine for comfort  Tele Hold antihypertensives   RENAL A:   AKI/ acute on chronic renal failure (baseline creatinine 3.35) Metabolic acidosis + gap acidosis  P:   Volume resuscitate  Hold antihypertensive Renal dose meds  Strict I&O  GASTROINTESTINAL A:   Chronic GIB w/ h/o cecal AVMs and diverticular disease -->on eliquis  P:   NPO  PPI  Stop Eliquis   HEMATOLOGIC A:   Acute on chronic GIB in setting of gastric AVMs; on Eliquis  P:  Cont PPI  Transfuse for Hgb <8 PAS   INFECTIOUS A:   No evidence of infection  P:   Trend fever and WBC curve   ENDOCRINE A:   Hyperglycemia  P:   ssi if > 180   NEUROLOGIC A:  No acute  P:   Supportive care    FAMILY  - Updates: at bedside 5/23  - Inter-disciplinary family meet or Palliative Care meeting due by:  5/30     TODAY'S SUMMARY: 81 year old male admitted w/ demand ischemia in setting of slow GIB from AVMs. Still symptomatic. Will rx primarily w/ transfusion, O2 and morphine. Accept SBP > 100. Hold antihypertensives. Hope we can avoid central access.   Erick Colace ACNP-BC Friend Pager # [phone] OR # [phone] if no  answer   08/03/2014, 11:37 AM   Reviewed above, examined.  81 yo male with chest pain, and blood in stool.  He has hx of pancytopenia and colonic AVMs.  He was started on NTG gtt and had some improvement in his chest pain >> developed hypotension.  Has significant drop in Hb from baseline.    He still has chest discomfort.  Breathing okay, and denies abdominal pain.  He is pale.  Heart rate regular.  No wheeze.  Abd soft.  No edema or rashes.  Normal strength.  Continue with blood transfusion for goal Hb > 8.  Add back NTG gtt as tolerated.  PRN morphine for chest pain.  F/u CBC.  Defer pressors, CVL placement for now.  Goals of care >> no CPR, no defibrillation, no intubation.  Updated pt's family at bedside.  Chesley Mires, MD [facility] 08/01/2014, 12:32 PM Pager:  [phone] After 3pm call: [phone]

## 2014-08-12 NOTE — ED Notes (Signed)
Pt BP dropped to 80/54 - MD at bedside moving pt around and sitting pt up to assess lungs, rechecked BP 90/46, stopped nitroglycerin drip at this time due to decrease in BP.

## 2014-08-12 NOTE — Progress Notes (Signed)
20mg  Etomidate, 4 mg versed and 100 mcg fentanyl wasted with Magda RN. Reflected in Pyxis.

## 2014-08-12 NOTE — ED Notes (Signed)
Cardiac zoll pads placed on patients chest and back.

## 2014-08-12 NOTE — ED Notes (Signed)
CRITICAL VALUE ALERT  Critical value received:  CG4 5.5  Date of notification:  08/18/2014  Time of notification:  1229  Critical value read back: YES  Nurse who received alert: Renne Crigler RN   MD notified: Marni Griffon NP

## 2014-08-12 NOTE — Procedures (Signed)
Intubation Procedure Note CAMILLO QUADROS [id number] 15-May-1925  Procedure: Intubation Indications: Respiratory insufficiency  Procedure Details Consent: Risks of procedure as well as the alternatives and risks of each were explained to the (patient/caregiver).  Consent for procedure obtained. Time Out: Verified patient identification, verified procedure, site/side was marked, verified correct patient position, special equipment/implants available, medications/allergies/relevent history reviewed, required imaging and test results available.  Performed  Drugs:  None. IDL using glidescope x 1 with # 4 blade. Grade 1 view. 8.0 tube visualized passing through vocal cords. Following intubation:  positive color change on ETCO2, condensation seen in endotracheal tube, equal breath sounds bilaterally.  Evaluation Hemodynamic Status: Persistent hypotension treated with pressors; O2 sats: transiently fell during during procedure Patient's Current Condition: unstable Complications: No apparent complications Patient did tolerate procedure well. Chest X-ray ordered to verify placement.  CXR: pending.   Montey Hora, [facility] Pager: [phone]  or [phone] 07/27/2014, 6:08 PM

## 2014-08-12 NOTE — Consult Note (Signed)
Christian Gillespie   DOB:08/24/1925   WJ#:[id number]   AOZ#:[id number]  Patient Care Team: Christian Kettle, MD as PCP - General (Family Medicine) Christian Dresser, MD as Consulting Physician (Cardiology) Christian Shiley, MD as Consulting Physician (Nephrology)  Subjective: Events since admission noted. Christian Gillespie 81 year old male with multiple medical issues, who was being evaluated as an outpatient for MDS, among other bone marrow abnormalities. The patient was to have a bone marrow biopsy performed today. However, he was admitted on 08/11/2014 With chest pain, associated with shortness of breath and nausea. He was very weak. He denies any vomiting, but He did notice dark stools without gross blood. He denied any fever or chills or night sweats. He denies any aspirin or NSAIDs. He was on eliquis. Chest x-ray was negative for acute changes. EKG showed diffuse ST segment depressions. Initial troponin was 0.69. He had pancytopenia, with a hemoglobin of 7.2, platelets 91. He also has mild renal insufficiency. Cardiology was involved to care for his cardiac needs. GI consultation was obtained as well. Likely cause of his GI bleed was Gastritis and colonic AVM. He was initiated on PPIs.He was transfused 2 units of blood given acute coronary syndrome. Eliquis was dc'd. He received 2 units of blood. His counts were closely monitored. Multispecialties involved Kindly informed of the patient's admission.   Scheduled Meds: . amiodarone  200 mg Oral Once  . pantoprazole  40 mg Oral Q0600   Continuous Infusions: . sodium chloride 20 mL/hr at 07/27/2014 0616  . sodium chloride     PRN Meds:.  Objective:  Filed Vitals:   07/25/2014 1115  BP: 95/64  Pulse: 101  Temp:   Resp: 13      Intake/Output Summary (Last 24 hours) at 08/07/2014 1133 Last data filed at 08/08/2014 1113  Gross per 24 hour  Intake    335 ml  Output      0 ml  Net    335 ml      GENERAL:alert, no distress and Week, chronically ill-appearing SKIN:  skin color, texture, turgor are normal, no rashes or significant lesions EYES: normal, conjunctiva are pink and non-injected, sclera clear OROPHARYNX:no exudate, no erythema and lips, buccal mucosa, and tongue normal  NECK: supple, thyroid normal size, non-tender, without nodularity LYMPH:  no palpable lymphadenopathy in the cervical, axillary or inguinal LUNGS: clear to auscultation and percussion with normal breathing effort HEART: regular rate & rhythm and no murmurs and no lower extremity edema ABDOMEN: soft, non-tender and normal bowel sounds Musculoskeletal:no cyanosis of digits and no clubbing  PSYCH: alert & oriented x 3 with fluent speech NEURO: no focal motor/sensory deficits    CBG (last 3)  No results for input(s): GLUCAP in the last 72 hours.   Labs:   Recent Labs Lab 08/07/14 1207 08/11/2014 0600  WBC 1.9* 3.9*  HGB 8.1* 7.2*  HCT 25.3* 22.6*  PLT 68* 91*  MCV 105* 102.3*  MCH 33.5* 32.6  MCHC 32.0 31.9  RDW 15.2 16.7*  LYMPHSABS 1.1  --   EOSABS 0.0  --   BASOSABS 0.0  --      Chemistries:    Recent Labs Lab 08/07/14 1208 08/20/2014 0600  NA 141 141  K 4.4 4.2  CL 107 107  CO2 26 18*  GLUCOSE 90 186*  BUN 32* 49*  CREATININE 2.36* 3.56*  CALCIUM 9.0 9.2  AST 18 28  ALT 10 13*  ALKPHOS 53 43  BILITOT 0.5 1.0    GFR Estimated  Creatinine Clearance: 13.4 mL/min (by C-G formula based on Cr of 3.56).  Liver Function Tests:  Recent Labs Lab 08/07/14 1208 08/19/2014 0600  AST 18 28  ALT 10 13*  ALKPHOS 53 43  BILITOT 0.5 1.0  PROT 6.6 6.7  ALBUMIN 4.1 3.9   Coagulation profile  Recent Labs Lab 07/21/2014 0600  INR 1.52*    Cardiac Enzymes:  Recent Labs Lab 07/24/2014 0731  TROPONINI 0.69*     Imaging Studies:  Dg Chest Port 1 View  07/23/2014   CLINICAL DATA:  Shortness of breath and hypotension  EXAM: PORTABLE CHEST - 1 VIEW  COMPARISON:  Aug 12, 2014  FINDINGS: There is now ill-defined opacity in the right lower lobe  concerning for early pneumonia. Lungs elsewhere clear. Heart is upper normal in size with pulmonary vascularity within normal limits. No adenopathy. No bone lesions.  IMPRESSION: Hazy opacity in the right base suspicious for early pneumonia. Lungs elsewhere clear.   Electronically Signed   By: Lowella Grip III M.D.   On: 07/26/2014 11:19   Dg Chest Port 1 View  08/08/2014   CLINICAL DATA:  Chest pain  EXAM: PORTABLE CHEST - 1 VIEW  COMPARISON:  04/10/2013  FINDINGS: Normal heart size and mediastinal contours for technique. No acute infiltrate or edema. No effusion or pneumothorax. No acute osseous findings.  IMPRESSION: Negative portable chest.   Electronically Signed   By: Monte Fantasia M.D.   On: 07/21/2014 06:37   Blood smear review 5/18: Normochromic and normocytic population of red blood cells. He has no schistocytes or spherocytes. There is no nuclear red cells. He has no teardrop cells. There is no target cells. He has no inclusion bodies. There is no rouleau formation. White cells appear normal in morphology maturation. He may have a couple atypical lymphocytes. I do not see any hyper segmented polys. He has no immature myeloid cells. There is no blasts. Platelets are decreased in number. He has several large platelets. Platelets are well granulated.   04/2014 Colonoscopy. For IDA, FOBT +.  4 polyps removed, cecal AVM, sigmoid tics, grade 1 internal rrhoids Path: tubular adenomas x 4.  04/2014 EGD Atrophic gastritis. Gastric cardia nodule.  Path: no H pylori, HP polyp.  IDA likely due to blood loss from cecal AVM and/or poor absorption due to atrophic gastritis  Assessment/Plan: 81 y.o.   MDS/Rule out hairy Cell leukemia Patient was scheduled for a bone marrow biopsy to be performed on May 20 416, however due to these current hospitalization, a bone marrow biopsy may be entertained during this hospital stay. Docto Ennever to see the patient later today with further  recommendations.  Anemia In the setting of MDS, GIB likely  due to AVM from cecum Recent B-12 level was 518 and iron studies showed Iron of 87,TIBC of 276, Saturation 25.5, Ferritin 34 Epo levels On 08/07/2014 were 33.6. He received 2 units of blood due to acute coronary syndrome and GI bleed. eliquis was placed on hold. Monitor counts closely He will likely need a bone marrow biopsy during this hospitalization.  Thrombocytopenia In the setting of MDS Monitor counts closely No transfusion is indicated at this time He will likely need a bone marrow biopsy during this hospitalization.  Leukopenia Due to MDS Continue to closely monitor He will likely need a bone marrow biopsy during this hospitalization.  Partial DO NOT RESUSCITATE-DO NOT INTUBATE  Other medical issues as per admitting team     [facility], PA-C 08/09/2014  11:33 AM

## 2014-08-12 NOTE — ED Provider Notes (Signed)
Physical Exam  BP 101/60 mmHg  Pulse 101  Resp 11  Ht 5' 6.5" (1.689 m)  Wt 170 lb (77.111 kg)  BMI 27.03 kg/m2  SpO2 98%  Physical Exam  ED Course  Procedures  MDM Care assumed at sign out from Dr. Tomi Bamberger. Patient came in overnight with chest pain. Still on eliquis for afib. Was diagnosed with NSTEMI, GI bleed. 1 U PRBC ordered by Dr. Tomi Bamberger. Placed on nitro drip. She consulted cardiology, who didn't call back. I am concerned for GI bleeding causing NSTEMI. Consulted [facility] to evaluate. Recommend protonix 40 mg IV BID and they will see patient. Discussed with Trish from cardiology, who will see patient. Held heparin drip because of GI bleed. Will admit to stepdown under hospitalist.   CRITICAL CARE Performed by: Darl Householder, Jc   Total critical care time: 30 min   Critical care time was exclusive of separately billable procedures and treating other patients.  Critical care was necessary to treat or prevent imminent or life-threatening deterioration.  Critical care was time spent personally by me on the following activities: development of treatment plan with patient and/or surrogate as well as nursing, discussions with consultants, evaluation of patient's response to treatment, examination of patient, obtaining history from patient or surrogate, ordering and performing treatments and interventions, ordering and review of laboratory studies, ordering and review of radiographic studies, pulse oximetry and re-evaluation of patient's condition.   Results for orders placed or performed during the hospital encounter of 08/02/2014  APTT  Result Value Ref Range   aPTT 23 (L) 24 - 37 seconds  CBC  Result Value Ref Range   WBC 3.9 (L) 4.0 - 10.5 K/uL   RBC 2.21 (L) 4.22 - 5.81 MIL/uL   Hemoglobin 7.2 (L) 13.0 - 17.0 g/dL   HCT 22.6 (L) 39.0 - 52.0 %   MCV 102.3 (H) 78.0 - 100.0 fL   MCH 32.6 26.0 - 34.0 pg   MCHC 31.9 30.0 - 36.0 g/dL   RDW 16.7 (H) 11.5 - 15.5 %   Platelets 91 (L)  150 - 400 K/uL  Comprehensive metabolic panel  Result Value Ref Range   Sodium 141 135 - 145 mmol/L   Potassium 4.2 3.5 - 5.1 mmol/L   Chloride 107 101 - 111 mmol/L   CO2 18 (L) 22 - 32 mmol/L   Glucose, Bld 186 (H) 65 - 99 mg/dL   BUN 49 (H) 6 - 20 mg/dL   Creatinine, Ser 3.56 (H) 0.61 - 1.24 mg/dL   Calcium 9.2 8.9 - 10.3 mg/dL   Total Protein 6.7 6.5 - 8.1 g/dL   Albumin 3.9 3.5 - 5.0 g/dL   AST 28 15 - 41 U/L   ALT 13 (L) 17 - 63 U/L   Alkaline Phosphatase 43 38 - 126 U/L   Total Bilirubin 1.0 0.3 - 1.2 mg/dL   GFR calc non Af Amer 14 (L) >60 mL/min   GFR calc Af Amer 16 (L) >60 mL/min   Anion gap 16 (H) 5 - 15  Protime-INR  Result Value Ref Range   Prothrombin Time 18.3 (H) 11.6 - 15.2 seconds   INR 1.52 (H) 0.00 - 1.49  I-Stat Troponin, ED (not at [facility], [facility])  Result Value Ref Range   Troponin i, poc 0.23 (HH) 0.00 - 0.08 ng/mL   Comment NOTIFIED PHYSICIAN    Comment 3          POC occult blood, ED RN will collect  Result Value Ref  Range   Fecal Occult Bld POSITIVE (A) NEGATIVE  Type and screen  Result Value Ref Range   ABO/RH(D) A POS    Antibody Screen NEG    Sample Expiration 08/15/2014   Prepare RBC  Result Value Ref Range   Order Confirmation ORDER PROCESSED BY BLOOD BANK    Dg Chest Port 1 View  08/20/2014   CLINICAL DATA:  Chest pain  EXAM: PORTABLE CHEST - 1 VIEW  COMPARISON:  04/10/2013  FINDINGS: Normal heart size and mediastinal contours for technique. No acute infiltrate or edema. No effusion or pneumothorax. No acute osseous findings.  IMPRESSION: Negative portable chest.   Electronically Signed   By: Monte Fantasia M.D.   On: 08/06/2014 06:37      Wandra Arthurs, MD 08/09/2014 [phone]

## 2014-08-12 NOTE — ED Notes (Addendum)
Per MD Short bolus in 2nd unit of blood.

## 2014-08-12 NOTE — Consult Note (Addendum)
CARDIOLOGY CONSULTATION NOTE.  NAME:  Christian Gillespie   MRN: [id number] DOB:  02-Jul-1925   ADMIT DATE: 07/22/2014  Reason for Consult: Chest pain with positive troponin in setting of GI bleed; paroxysmal atrial fibrillation on anticoagulation, known CAD  Requesting Physician: Dr. Edwin Cap / along with EDP  Primary Cardiologist: [name], M.D.  HPI: This is a 81 y.o. male with a past medical history significant for chronic CAD with known CTO of the RCA from 1991 (has had 2 stress test within the last 2 years showing no ischemia but moderate size infarct in the inferior/inferolateral wall), along with at least one year history of paroxysmal A. fib from February 2015 with recurrence in February 2016 -- currently taking Eliquis for anticoagulation.  He has had echocardiographic evidence of normal EF but mildly reduced by nuclear stress test in the past. He is also been evaluated by gastroenterology for cecal AVMs and gastritis. His hemoglobin on April 28 was 9.1, having reduced to 8.1 in May 18 and is currently 7.2.  The presented to [facility] emergency room early this morning reporting off and on exertional chest pain over the last week. Last night the chest discomfort lasted all night long diffusely across his chest radiating to her shoulder blades. His also noted nausea vomiting 2. Mild dyspnea. Denies any melena or hematochezia, but was positive for FOBT.   He notes having taken at least 4 sublingual nitroglycerin with brief improvement of his chest discomfort.  Cardiovascular ROS: positive for - chest pain, dyspnea on exertion and fatigue negative for - edema, irregular heartbeat, loss of consciousness, murmur, orthopnea, palpitations, paroxysmal nocturnal dyspnea, rapid heart rate or TIA/Amaurosis Fugax, syncope/near syncope  PMHx:  Past Medical History  Diagnosis Date  . Coronary artery chronic total occlusion     a. Loraine 1991 with totally occluded RCA. No intervention;  b.  Lexiscan Cardiolite in (05/2013) showed EF 41%, moderate inferior infarction with minimal peri-infarct ischemia.; c. Myoview (04/2014): EF 44%, inferior infarct, no ischemia, intermediate risk due to size of infarct (med rx cont'd due to no ischemia)   . Paroxysmal a-fib     Diagnosed in 04/2013. TEE-guided cardioversion in 2/15 but went back into atrial fibrillation. Amiodarone begun and patient went back into NSR.   Marland Kitchen Cardiomyopathy     a. Suspect tachycardia-mediated >> TEE (2/15) with EF 30%, diffuse hypokinesis, moderate MR, mildly decreased RV systolic function; EF 01% by Lexiscan Cardiolite in 3/15;  b.  Echo (07/2013) with EF 50-55%, hypokinesis of the basal to mid inferolateral and inferior walls.   Marland Kitchen PVC (premature ventricular contraction)   . Kidney stones   . Hypertension   . Hyperlipidemia   . Hard of hearing     hearing aid right ear/hole in left eardrum  . Macular degeneration   . Sinus bradycardia   . Colon polyps     a. Colo 2/16: polys in cecum, sigmoid and desc colon, cecal AVM, sigmoid diverticulosis, int hem's  . H/O endoscopy     a. EGD 2/16:  atrophic gastritis in gastric fundus and gastric body, nodule in gastric cardia (bx performed), o/w normal - Fe def anemia likely from blood loss due to cecal AVM and/or poor Fe absorption from atrophic gastritis    Past Surgical History  Procedure Laterality Date  . Cystoscopy w/ retrogrades  07/19/2003    Double-J stent placement  . Cystoscopy w/ retrogrades  08/08/2001    Double-J stent placement, Stone manipulation, Ureteroscopy  .  Appendectomy  1952  . Tonsillectomy      as a child  . Cardiac catheterization  08/02/1989    EF - between 40 and 50%; 100% RCA CTO  . Tee without cardioversion N/A 05/04/2013    Procedure: TRANSESOPHAGEAL ECHOCARDIOGRAM (TEE);  Surgeon: Larey Dresser, MD;  Location: [facility];  Service: Cardiovascular;  Laterality: N/A;  . Cardioversion N/A 05/04/2013    Procedure: CARDIOVERSION;   Surgeon: Larey Dresser, MD;  Location: [facility] ENDOSCOPY;  Service: Cardiovascular;  Laterality: N/A;    FAMHx: Family History  Problem Relation Age of Onset  . Breast cancer Mother   . Heart attack Neg Hx   . Stroke Neg Hx   . Cancer Mother     SOCHx:  reports that he quit smoking about 30 years ago. His smoking use included Cigarettes. He has never used smokeless tobacco. He reports that he does not drink alcohol or use illicit drugs.  ALLERGIES: Allergies  Allergen Reactions  . Codeine Other (See Comments)    dizzy  . Imdur [Isosorbide]     dizzy    ROS: A comprehensive review of systems was performed. Review of Systems  Constitutional: Positive for malaise/fatigue (LAST 2 DAYS).  HENT: Negative for nosebleeds.   Respiratory: Positive for shortness of breath (walking up steps - over past few days, but intermittently otherwise). Negative for cough and wheezing.   Cardiovascular: Positive for chest pain and leg swelling (trace - chronic). Negative for orthopnea.       Per HPI  Gastrointestinal: Positive for nausea and vomiting (x 1 yesterday). Negative for blood in stool and melena.  Genitourinary: Negative for hematuria and flank pain.  Musculoskeletal: Negative for myalgias.  Neurological: Negative for dizziness, sensory change, speech change, focal weakness, loss of consciousness and headaches.  Endo/Heme/Allergies: Bruises/bleeds easily.  Psychiatric/Behavioral: Negative for depression. The patient is not nervous/anxious.   All other systems reviewed and are negative.   HOME MEDICATIONS: No current facility-administered medications on file prior to encounter.   Current Outpatient Prescriptions on File Prior to Encounter  Medication Sig Dispense Refill  . amiodarone (PACERONE) 200 MG tablet TAKE 1 TABLET TWICE A DAY FOR 1 WEEK THEN 1 TABLET DAILY (Patient taking differently: Take 0.5 tablet daily) 90 tablet 1  . amLODipine (NORVASC) 5 MG tablet Take 1 tablet (5 mg  total) by mouth daily. 30 tablet 6  . apixaban (ELIQUIS) 2.5 MG TABS tablet Take 1 tablet (2.5 mg total) by mouth 2 (two) times daily. 60 tablet 11  . Coenzyme Q10 200 MG TABS Take 1 tablet (200 mg total) by mouth daily.    . metoprolol succinate (TOPROL-XL) 25 MG 24 hr tablet Take 0.5 tablets (12.5 mg total) by mouth daily. (Patient taking differently: Take 25 mg by mouth daily. )    . nitroGLYCERIN (NITROSTAT) 0.4 MG SL tablet Place 1 tablet (0.4 mg total) under the tongue every 5 (five) minutes as needed for chest pain. 25 tablet 2  . Nutritional Supplements (PROSTA VITE PO) Take 1 tablet by mouth daily.    . pravastatin (PRAVACHOL) 80 MG tablet Take 80 mg by mouth daily.        (Not in a hospital admission)  HOSPITAL MEDICATIONS: I have reviewed the patient's current medications.  VITALS: Blood pressure 98/54, pulse 106, temperature 97.3 F (36.3 C), temperature source Oral, resp. rate 14, height 5' 6.5" (1.689 m), weight 77.111 kg (170 lb), SpO2 92 %. PHYSICAL EXAM: General appearance: alert, cooperative, appears stated age,  mild distress, mildly obese, pale and mildly ill appearing - non-toxic HEENT: [place]/AT, EOMI, MMM, anicteric sclera - but conjunctival & sublingual pallor Neck: JVD - a few cm above sternal notch, no adenopathy, no carotid bruit, supple, symmetrical, trachea midline and thyroid not enlarged, symmetric, no tenderness/mass/nodules Lungs: mild bibasilar (L>R) rales, otherwise CTAB with normal Effort, no Wheezing Heart: tachcardic, Reg Rhythm; Normal S1 & split S2, 1/6 HSM @ apex; non-displaced PMI Abdomen: soft, non-tender; bowel sounds normal; no masses,  no organomegaly Extremities: extremities normal, atraumatic, no cyanosis or edema Pulses: 2+ and symmetric Skin: pale with conjuncival pallor Neurologic: Mental status: Alert, oriented, thought content appropriate, affect: normal Cranial nerves: normal; Strength 5/5/ throughout   LABS: Results for orders placed  or performed during the hospital encounter of 07/21/2014 (from the past 24 hour(s))  Type and screen     Status: None (Preliminary result)   Collection Time: 08/01/2014  5:14 AM  Result Value Ref Range   ABO/RH(D) A POS    Antibody Screen NEG    Sample Expiration 08/15/2014    Unit Number [id number]    Blood Component Type RED CELLS,LR    Unit division 00    Status of Unit ALLOCATED    Transfusion Status OK TO TRANSFUSE    Crossmatch Result Compatible    Unit Number [id number]    Blood Component Type RED CELLS,LR    Unit division 00    Status of Unit ISSUED    Transfusion Status OK TO TRANSFUSE    Crossmatch Result Compatible   Prepare RBC     Status: None   Collection Time: 08/01/2014  5:14 AM  Result Value Ref Range   Order Confirmation ORDER PROCESSED BY BLOOD BANK   ABO/Rh     Status: None   Collection Time: 07/21/2014  5:14 AM  Result Value Ref Range   ABO/RH(D) A POS   APTT     Status: Abnormal   Collection Time: 08/14/2014  6:00 AM  Result Value Ref Range   aPTT 23 (L) 24 - 37 seconds  CBC     Status: Abnormal   Collection Time: 08/11/2014  6:00 AM  Result Value Ref Range   WBC 3.9 (L) 4.0 - 10.5 K/uL   RBC 2.21 (L) 4.22 - 5.81 MIL/uL   Hemoglobin 7.2 (L) 13.0 - 17.0 g/dL   HCT 22.6 (L) 39.0 - 52.0 %   MCV 102.3 (H) 78.0 - 100.0 fL   MCH 32.6 26.0 - 34.0 pg   MCHC 31.9 30.0 - 36.0 g/dL   RDW 16.7 (H) 11.5 - 15.5 %   Platelets 91 (L) 150 - 400 K/uL  Comprehensive metabolic panel     Status: Abnormal   Collection Time: 08/20/2014  6:00 AM  Result Value Ref Range   Sodium 141 135 - 145 mmol/L   Potassium 4.2 3.5 - 5.1 mmol/L   Chloride 107 101 - 111 mmol/L   CO2 18 (L) 22 - 32 mmol/L   Glucose, Bld 186 (H) 65 - 99 mg/dL   BUN 49 (H) 6 - 20 mg/dL   Creatinine, Ser 3.56 (H) 0.61 - 1.24 mg/dL   Calcium 9.2 8.9 - 10.3 mg/dL   Total Protein 6.7 6.5 - 8.1 g/dL   Albumin 3.9 3.5 - 5.0 g/dL   AST 28 15 - 41 U/L   ALT 13 (L) 17 - 63 U/L   Alkaline Phosphatase 43 38 - 126  U/L   Total Bilirubin 1.0 0.3 - 1.2 mg/dL  GFR calc non Af Amer 14 (L) >60 mL/min   GFR calc Af Amer 16 (L) >60 mL/min   Anion gap 16 (H) 5 - 15  Protime-INR     Status: Abnormal   Collection Time: 08/14/2014  6:00 AM  Result Value Ref Range   Prothrombin Time 18.3 (H) 11.6 - 15.2 seconds   INR 1.52 (H) 0.00 - 1.49  I-Stat Troponin, ED (not at [facility])     Status: Abnormal   Collection Time: 07/28/2014  6:04 AM  Result Value Ref Range   Troponin i, poc 0.23 (HH) 0.00 - 0.08 ng/mL   Comment NOTIFIED PHYSICIAN    Comment 3          POC occult blood, ED RN will collect     Status: Abnormal   Collection Time: 08/02/2014  6:37 AM  Result Value Ref Range   Fecal Occult Bld POSITIVE (A) NEGATIVE  Troponin I     Status: Abnormal   Collection Time: 08/07/2014  7:31 AM  Result Value Ref Range   Troponin I 0.69 (HH) <0.031 ng/mL    IMAGING: No results found.   EKG: Sinus tachycardia (106) with PACs, RBBB and possible left posterior fascicular block - broad QRS complexes, with possible lateral ST depressions.  Previous EKG had sinus rhythm with PVCs and RBBB.  IMPRESSION: Positive troponins and chest pain in the setting of likely GI bleed related anemia while on current anticoagulation. He has known coronary disease but with recent stress test no ischemia (read as abnormal due to size of infarct that is chronic).  I would consider + Troponin in this patient to be more Demand Ischemia related with existing CAD & Anemia - increased demand with inadequate supply via collateral vessels.    Principal Problem:   Acute on Chronic Anemia Active Problems:   Demand ischemia of myocardium   Acute on chronic kidney failure   Chronic total occlusion of native Right coronary artery   Hypercholesterolemia   Essential hypertension   PAF (paroxysmal atrial fibrillation)   Chronic combined systolic and diastolic CHF (congestive heart failure)   Pancytopenia   RECOMMENDATION: 1. Agree with transfusing  to get hemoglobin above 9 preferentially.  - suspect that current Hgb level is not truly indicative of actual nadir. 2. Will d/c Eliquis for now indefinietely - defer timing of restarting (+/- actually restarting in future) to Dr. Aundra Dubin, who will see him in AM. 3. Would for now simply treat medically for his anginal pain with nitrates as BP tolerates (currently stopping NTG infusion for SBP in 90s) and transfusion to replenish Hgb level.     I will also add Ranexa for antianginal effect that does not drop BP  He may need IV lasix with transfusion - follow SaO2 & check AM CXR  He has baseline anemia related to CKD & has BM Biopsy tentatively scheduled for tomorrow - w/ Dr. Marin Olp. 4. No plan for invasive cardiac evaluation - with recent negative Cardiolite ST & worsening renal function. 5. For now - would hold BB & CCB to avoid hypotension.  Continue amiodarone for Afib rhythm/rate control. --> restart BB once BP will tolerate.   6. While on IV NTG - hold Imdur - restart when BP will tolerate off NTG infusion. 7. Continue statin    Time Spent Directly with Patient: 45 minutes  Dmitry Macomber, Leonie Green, M.D., M.S. Interventional Cardiologist   Pager # [phone]

## 2014-08-12 NOTE — ED Notes (Signed)
Gave pt oral swab, per Santiago Glad - RN.

## 2014-08-12 NOTE — H&P (View-Only) (Signed)
   CODE BLUE LINE  I was called back to see the patient does have a mental status change at roughly 1700. He did just started on amiodarone IV when his rhythm changed dramatically into a bradycardic rhythm began to have worsening respiratory status with hypoxia.  Amiodarone drip was discontinued.  Upon my arrival to the NICU he was being attended to by the critical care staff. He was extremely agitated, pulling on lines and having altered mental status. We decided that he wouldn't likely need to be intubated. During this part of evaluation he promptly went from a normal sinus rhythm of roughly 55 beats a minute to asystole with no pulse.  CPR was immediately initiated at 1730 hrs. He was given a total of 3 rounds of epinephrine, 1-1/2 ampules of atropine, 1 amp of calcium chloride, he was also given an amp of bicarbonate.  Initially he did have a pulse which subsequently was lost after the first round of epinephrine. Was transcutaneously paced at 90 beats a minute, but was only capturing roughly half of those beats. He eventually had full restoration of a normal perfusing rhythm but was noted to have a blood pressure in the 80s. He was started on Levothroid as well as Neo-Synephrine. Bicarbonate drip was also initiated.  Follow-up labs showed a troponin of 17 and a creatinine that was worsening to 4.2 from 3.6. He was acidotic with a lactate of 11 post arrest.   CPR was from 1730-7046 hours. Please see CPR sheet. The patient was intubated by critical care during the code. A femoral arterial line was placed in the right femoral artery as well as a subsequent right IJ cordis line placed by PCCM.   Following this restoration of spontaneous rhythm discussed with the critical care team in light of the troponin being 17 resulted this may very well be indicative of coronary ischemia from another distribution other than the known right coronary artery. The wife reports somewhat along the line he had a  cardiac catheterization showing maybe a 60% stenosis in one of the other arteries. This has been treated medically with negative stress test as late as this February.  The concern is whether or not the troponin elevation is related to a new stenosis versus simply from shock.  After long discussion with the patient's wife and son with the chaplain present, I discussed potential options of conservative management with medical therapy overnight versus potentially withdrawal of care versus the most aggressive management would be to proceed to cardiac catheterization. His wife and son both wished to be aggressive and proceed with his Fransico Michael could an attempt to preserve the patient's life. I did explain to them that this would be at risk of potentially irreversible damage to his kidneys living and potentially a dialysis dependent patient. There is also a chance that he may not be deemed a dialysis candidate.  Aware of these results understand the risks and benefits including  Cardiac arrest, death, MI, bleeding, renal insufficiency or vascular access issues.  Since the patient was not adequately capturing from the transcutaneous pacing technique, we will also place a temporary pacemaker for nighttime stability.   Following the code, the patient was on Neo-Synephrine and Levothroid drip ( 80 mg and 10 mg respectively).   Leonie Man, M.D., M.S. Interventional Cardiologist   Pager # [phone]

## 2014-08-12 NOTE — Interval H&P Note (Signed)
Cath Lab Visit (complete for each Cath Lab visit)  Clinical Evaluation Leading to the Procedure:   ACS: Yes.    Non-ACS:    Anginal Classification: CCS IV  Anti-ischemic medical therapy: Maximal Therapy (2 or more classes of medications)  Non-Invasive Test Results: No non-invasive testing performed  Prior CABG: No previous CABG      History and Physical Interval Note:  08/03/2014 7:41 PM  Christian Gillespie  has presented today for surgery, with the diagnosis of Unstable Angina  The various methods of treatment have been discussed with the patient and family. After consideration of risks, benefits and other options for treatment, the patient has consented to  Procedure(s): Left Heart Cath and Coronary Angiography (N/A) Temporary Pacemaker Insertion as a surgical intervention .  The patient's history has been reviewed, patient examined, no change in status, stable for surgery.  I have reviewed the patient's chart and labs.  Questions were answered to the patient's satisfaction.     Quay Burow

## 2014-08-12 NOTE — ED Notes (Signed)
Critical care at bedside  

## 2014-08-12 NOTE — ED Notes (Signed)
Pt placed on 4L of O2, sats consistently decreasing below 90.

## 2014-08-12 NOTE — ED Notes (Signed)
Patient coming from home with c/o of central and left sided chest pain with radiation to the left and right arms with associated nausea, vomiting x 1 and muscle spasms in the shoulders.  Patient states the muscle spasms have been ongoing x 1 week.

## 2014-08-12 NOTE — Procedures (Signed)
Arterial Catheter Insertion Procedure Note ASANI MCBURNEY [id number] Oct 05, 1925  Procedure: Insertion of Arterial Catheter  Indications: Blood pressure monitoring and Frequent blood sampling  Procedure Details Consent: Unable to obtain consent because of emergent medical necessity. Time Out: Verified patient identification, verified procedure, site/side was marked, verified correct patient position, special equipment/implants available, medications/allergies/relevent history reviewed, required imaging and test results available.  Performed  Maximum sterile technique was used including antiseptics, cap, gloves, hand hygiene and mask. Skin prep: Chlorhexidine; local anesthetic administered 24 gauge catheter was inserted into right femoral artery using the Seldinger technique.  Evaluation Blood flow good; BP tracing good. Complications: No apparent complications.   Montey Hora, [facility] Pager: [phone]  or [phone] 08/20/2014, 6:12 PM

## 2014-08-12 NOTE — Progress Notes (Signed)
PCCM INTERVAL PROGRESS NOTE  Called to evaluate patient by staff RN for VF on monitor s/p cardiac catheterization. EKG reveals that this is not VF but more likely an intraventricular block based on prelim EKG interpretation. 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 Per Dr. Gwenlyn Found this anatomy is not compatible with survival and he is a poor surgical candidate. Temp pacer was placed and he was returned to ICU. I had discussion with Mrs Bail and the patient's son Gene. Dr Gwenlyn Found had made them aware of the cath results and the patient's prognosis in light of this. I discussed options regarding goal status at this time. They have decided that patient is be a DNR at this time. We will no initiate new therapies, but will continue vent, and current meds.    Georgann Housekeeper, AGACNP-BC [facility] Pulmonology/Critical Care Pager [phone] or [phone]  07/31/2014 9:54 PM

## 2014-08-12 NOTE — Consult Note (Signed)
[facility] Consult: 8:50 AM 07/21/2014  LOS: 0 days    Referring Provider: ED doc Darl Householder.  Primary Care Physician:   Melinda Crutch, MD Primary Gastroenterologist:  Dr. Fuller Plan     Reason for Consultation:  Anemia, FOBT + stool   HPI: Christian Gillespie is a 81 y.o. male.   Hx CAD, EF 44%.  A fib, s/p cardioversion, on Eliquis due to lots of post ablation PACs.  CKD stage 4.  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   Due to see hematologist Dr Marin Olp for bone marrow biopsy 5/24 to investigate pancytopenia.  04/2014 Colonoscopy.  For IDA, FOBT +.  4 polyps removed, cecal AVM, sigmoid tics, grade 1 internal rrhoids Path: tubular adenomas x 4.  04/2014  EGD Atrophic gastritis.  Gastric cardia nodule.  Path: no H pylori, HP polyp.  IDA likely due to blood loss from cecal AVM and/or poor absorption due to atrophic gastritis  In ED with exertional chest pain radiating to both arms intermittently for one week, present all night long last night.  +n/v of clear foam once this AM during bad .  Muscle spasm in shoulders.  +weakness.  Little improvement post sl NTG.  Ruled in for non STEMI hgb 7.2 with MCV 102. Platelets  91.  PT/INR 18.3/1.5.  FOBT + brown stool.  hgb of 04/30/14 9.6,  9.1 on 4/28.   No anorexia, no abd pain, no dark stool.  No altered bowel habits.  No weight loss.  No PPI.  No NSAIDs.     Past Medical History  Diagnosis Date  . Coronary artery chronic total occlusion     a. [place] 1991 with totally occluded RCA. No intervention;  b. Lexiscan Cardiolite in (05/2013) showed EF 41%, moderate inferior infarction with minimal peri-infarct ischemia.; c. Myoview (04/2014): EF 44%, inferior infarct, no ischemia, intermediate risk due to size of infarct (med rx cont'd due to no  ischemia)   . Paroxysmal a-fib     Diagnosed in 04/2013. TEE-guided cardioversion in 2/15 but went back into atrial fibrillation. Amiodarone begun and patient went back into NSR.   Marland Kitchen Cardiomyopathy     a. Suspect tachycardia-mediated >> TEE (2/15) with EF 30%, diffuse hypokinesis, moderate MR, mildly decreased RV systolic function; EF 02% by Lexiscan Cardiolite in 3/15;  b.  Echo (07/2013) with EF 50-55%, hypokinesis of the basal to mid inferolateral and inferior walls.   Marland Kitchen PVC (premature ventricular contraction)   . Kidney stones   . Hypertension   . Hyperlipidemia   . Hard of hearing     hearing aid right ear/hole in left eardrum  . Macular degeneration   . Sinus bradycardia   . Colon polyps     a. Colo 2/16: polys in cecum, sigmoid and desc colon, cecal AVM, sigmoid diverticulosis, int hem's  . H/O endoscopy     a. EGD 2/16:  atrophic gastritis in gastric fundus and gastric body, nodule in gastric cardia (bx performed), o/w normal - Fe def anemia likely from blood  loss due to cecal AVM and/or poor Fe absorption from atrophic gastritis     Past Surgical History  Procedure Laterality Date  . Cystoscopy w/ retrogrades  07/19/2003    Double-J stent placement  . Cystoscopy w/ retrogrades  08/08/2001    Double-J stent placement, Stone manipulation, Ureteroscopy  . Appendectomy  1952  . Tonsillectomy      as a child  . Cardiac catheterization  08/02/1989    EF - between 40 and 50%; 100% RCA CTO  . Tee without cardioversion N/A 05/04/2013    Procedure: TRANSESOPHAGEAL ECHOCARDIOGRAM (TEE);  Surgeon: Larey Dresser, MD;  Location: [place];  Service: Cardiovascular;  Laterality: N/A;  . Cardioversion N/A 05/04/2013    Procedure: CARDIOVERSION;  Surgeon: Larey Dresser, MD;  Location: [place];  Service: Cardiovascular;  Laterality: N/A;    Prior to Admission medications   Medication Sig Start Date End Date Taking? Authorizing Provider  acetaminophen (TYLENOL) 500 MG tablet  Take 500 mg by mouth every 6 (six) hours as needed for moderate pain.   Yes Historical Provider, MD  amiodarone (PACERONE) 200 MG tablet TAKE 1 TABLET TWICE A DAY FOR 1 WEEK THEN 1 TABLET DAILY Patient taking differently: Take 0.5 tablet daily 04/11/14  Yes Larey Dresser, MD  amLODipine (NORVASC) 5 MG tablet Take 1 tablet (5 mg total) by mouth daily. 08/09/13  Yes Larey Dresser, MD  apixaban (ELIQUIS) 2.5 MG TABS tablet Take 1 tablet (2.5 mg total) by mouth 2 (two) times daily. 06/03/14  Yes Scott Joylene Draft, PA-C  Coenzyme Q10 200 MG TABS Take 1 tablet (200 mg total) by mouth daily. 07/18/14  Yes Larey Dresser, MD  isosorbide mononitrate (IMDUR) 30 MG 24 hr tablet Take 30 mg by mouth once.   Yes Historical Provider, MD  metoprolol succinate (TOPROL-XL) 25 MG 24 hr tablet Take 0.5 tablets (12.5 mg total) by mouth daily. Patient taking differently: Take 25 mg by mouth daily.  11/23/13  Yes Larey Dresser, MD  nitroGLYCERIN (NITROSTAT) 0.4 MG SL tablet Place 1 tablet (0.4 mg total) under the tongue every 5 (five) minutes as needed for chest pain. 05/02/14  Yes Liliane Shi, PA-C  Nutritional Supplements (PROSTA VITE PO) Take 1 tablet by mouth daily.   Yes Historical Provider, MD  pravastatin (PRAVACHOL) 80 MG tablet Take 80 mg by mouth daily.     Yes Historical Provider, MD    Scheduled Meds: . pantoprazole (PROTONIX) IV  40 mg Intravenous Q12H   Infusions: . sodium chloride 20 mL/hr at 07/21/2014 0616  . sodium chloride     PRN Meds:    Allergies as of 08/11/2014 - Review Complete 07/27/2014  Allergen Reaction Noted  . Codeine Other (See Comments) 09/01/2010  . Imdur [isosorbide]  07/18/2014    Family History  Problem Relation Age of Onset  . Breast cancer Mother   . Heart attack Neg Hx   . Stroke Neg Hx   . Cancer Mother     History   Social History  . Marital Status: Married    Spouse Name: N/A  . Number of Children: N/A  . Years of Education: N/A   Occupational History    . Not on file.   Social History Main Topics  . Smoking status: Former Smoker    Types: Cigarettes    Quit date: 04/09/1984  . Smokeless tobacco: Never Used     Comment: quit 26yeras ago  . Alcohol Use: No  .  Drug Use: No  . Sexual Activity: Not on file   Other Topics Concern  . Not on file   Social History Narrative    REVIEW OF SYSTEMS: See hpi.  12 system  Review.   PHYSICAL EXAM: Vital signs in last 24 hours: Filed Vitals:   08/05/2014 0830  BP: 103/55  Pulse: 99  Resp: 11   Wt Readings from Last 3 Encounters:  07/24/2014 170 lb (77.111 kg)  08/07/14 178 lb (80.74 kg)  07/18/14 177 lb 6.4 oz (80.468 kg)    General: looks pale, comfortable.   Head:  No swelling or asymmetry  Eyes:  No icterus Ears:  Hearing aid on right.   Nose:  No discharge or congestion Mouth:  Clear, dry MM Neck:  No mass.  No TMG Lungs:  Clear bil Heart: RRR.  No mrg Abdomen:  Soft, NT, ND.  No mass or HSM.  No bruits.  Active BS.   Rectal: brown, trace FOBT +   Musc/Skeltl: no joint pain or swelling.   Extremities:  No CCE.    Neurologic:  Oriented x 3.  No tremor .  No limb weakness Skin:  No telangectasia, sores or rashes.  Tattoos:  none Nodes:  No cervical adenopathuy.   Psych:  Pleasant, cooperative.    Intake/Output from previous day:   Intake/Output this shift:    LAB RESULTS:  Recent Labs  08/06/2014 0600  WBC 3.9*  HGB 7.2*  HCT 22.6*  PLT 91*   BMET Lab Results  Component Value Date   NA 141 07/30/2014   NA 141 08/07/2014   NA 141 07/18/2014   K 4.2 07/22/2014   K 4.4 08/07/2014   K 4.6 07/18/2014   CL 107 07/29/2014   CL 107 08/07/2014   CL 107 07/18/2014   CO2 18* 07/22/2014   CO2 26 08/07/2014   CO2 29 07/18/2014   GLUCOSE 186* 08/16/2014   GLUCOSE 90 08/07/2014   GLUCOSE 90 07/18/2014   BUN 49* 07/28/2014   BUN 32* 08/07/2014   BUN 30* 07/18/2014   CREATININE 3.56* 08/05/2014   CREATININE 2.36* 08/07/2014   CREATININE 2.42* 07/18/2014    CALCIUM 9.2 07/28/2014   CALCIUM 9.0 08/07/2014   CALCIUM 9.3 07/18/2014   LFT  Recent Labs  08/16/2014 0600  PROT 6.7  ALBUMIN 3.9  AST 28  ALT 13*  ALKPHOS 43  BILITOT 1.0   PT/INR Lab Results  Component Value Date   INR 1.52* 08/12/2014    RADIOLOGY STUDIES: Dg Chest Port 1 View  08/12/2014   CLINICAL DATA:  Chest pain  EXAM: PORTABLE CHEST - 1 VIEW  COMPARISON:  04/10/2013  FINDINGS: Normal heart size and mediastinal contours for technique. No acute infiltrate or edema. No effusion or pneumothorax. No acute osseous findings.  IMPRESSION: Negative portable chest.   Electronically Signed   By: Monte Fantasia M.D.   On: 08/12/2014 06:37    ENDOSCOPIC STUDIES: Per HPI.   IMPRESSION:   *  Pancytopenia.  Was for bone marrow bx tomorrow.  Dr Marin Olp stopped po iron and advised pt did not need B12 shots at OV last week.  Now with FOBT + and 2 gram drop in Hgb in one month.  Suspect the bleed is from cecal AVM.  Also suspect renal disease is contributing.   *  Angina, chest pain.  Dr Ellyn Hack believes this is demand ischemia.  Pt had negative stress test in 04/2014.   *  Chronic Eliquis, Dr  Ellyn Hack stopping this for now, and likely that it will never be restarted.    PLAN:     *  Switched PPI to oral once daily.    *  Ok to feed pt.    Azucena Freed  07/25/2014, 8:50 AM Pager: [phone]

## 2014-08-12 NOTE — Progress Notes (Signed)
  Echocardiogram 2D Echocardiogram has been performed.  Darlina Sicilian M 08/16/2014, 3:50 PM

## 2014-08-12 NOTE — Progress Notes (Signed)
   CODE BLUE LINE  I was called back to see the patient does have a mental status change at roughly 1700. He did just started on amiodarone IV when his rhythm changed dramatically into a bradycardic rhythm began to have worsening respiratory status with hypoxia.  Amiodarone drip was discontinued.  Upon my arrival to the NICU he was being attended to by the critical care staff. He was extremely agitated, pulling on lines and having altered mental status. We decided that he wouldn't likely need to be intubated. During this part of evaluation he promptly went from a normal sinus rhythm of roughly 55 beats a minute to asystole with no pulse.  CPR was immediately initiated at 1730 hrs. He was given a total of 3 rounds of epinephrine, 1-1/2 ampules of atropine, 1 amp of calcium chloride, he was also given an amp of bicarbonate.  Initially he did have a pulse which subsequently was lost after the first round of epinephrine. Was transcutaneously paced at 90 beats a minute, but was only capturing roughly half of those beats. He eventually had full restoration of a normal perfusing rhythm but was noted to have a blood pressure in the 80s. He was started on Levothroid as well as Neo-Synephrine. Bicarbonate drip was also initiated.  Follow-up labs showed a troponin of 17 and a creatinine that was worsening to 4.2 from 3.6. He was acidotic with a lactate of 11 post arrest.   CPR was from 1730-7046 hours. Please see CPR sheet. The patient was intubated by critical care during the code. A femoral arterial line was placed in the right femoral artery as well as a subsequent right IJ cordis line placed by PCCM.   Following this restoration of spontaneous rhythm discussed with the critical care team in light of the troponin being 17 resulted this may very well be indicative of coronary ischemia from another distribution other than the known right coronary artery. The wife reports somewhat along the line he had a  cardiac catheterization showing maybe a 60% stenosis in one of the other arteries. This has been treated medically with negative stress test as late as this February.  The concern is whether or not the troponin elevation is related to a new stenosis versus simply from shock.  After long discussion with the patient's wife and son with the chaplain present, I discussed potential options of conservative management with medical therapy overnight versus potentially withdrawal of care versus the most aggressive management would be to proceed to cardiac catheterization. His wife and son both wished to be aggressive and proceed with his Fransico Michael could an attempt to preserve the patient's life. I did explain to them that this would be at risk of potentially irreversible damage to his kidneys living and potentially a dialysis dependent patient. There is also a chance that he may not be deemed a dialysis candidate.  Aware of these results understand the risks and benefits including  Cardiac arrest, death, MI, bleeding, renal insufficiency or vascular access issues.  Since the patient was not adequately capturing from the transcutaneous pacing technique, we will also place a temporary pacemaker for nighttime stability.   Following the code, the patient was on Neo-Synephrine and Levothroid drip ( 80 mg and 10 mg respectively).   Leonie Man, M.D., M.S. Interventional Cardiologist   Pager # [phone]

## 2014-08-12 NOTE — H&P (Addendum)
[facility] History and Physical  Christian Gillespie FGH:[id number] DOB: [birth year] DOA: 08/02/2014  Referring physician:  Shirlyn Goltz PCP:   Melinda Crutch, MD   Chief Complaint:    HPI:  The patient is a 81 y.o. 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 Marin Olp who is scheduled a bone marrow biopsy and feels he may have hairy cell leukemia or other myelodysplasia who presents with chest pain.  For the last 2 weeks, he has had bandlike pressure around his chest, shoulders, shoulder blades that radiates down his left arm that has come on with exertion and is associated with some shortness of breath. It has gone away after a couple of minutes of rest. Yesterday around 5 PM, he had onset of 10 out of 10 soreness or pressure in the same distribution, no radiation to the neck or jaw, associated with shortness of breath and nausea. The pain has come and gone some but has been somewhat persistent 10 out of 10 and he has felt very weak. He denies diaphoresis or lightheadedness. He has had ongoing dark stools but no gross blood. He denies recent fevers, chills, cough, diarrhea. He denies recent NSAID use.  In the emergency department, vital signs notable for tachycardia to the low 100s, blood pressure stable. Chest x-ray without evidence of vascular congestion, edema, or pneumonia. His EKG shows diffuse ST segment depressions.  His initial troponin is 0.69. His occult positive. His labs are notable for pancytopenia with a  hemoglobin down to 7.2, recently 9, platelets 91. His creatinine is up to 3.56, baseline of around 2.4, with some associated mild acidosis CO2 18.  In the emergency department, he was given an aspirin and started on nitroglycerin infusion which is taken his chest pain from a 10 out of 10 to an 8 out of 10. He has been ordered a blood transfusion. Gastroenterology, cardiology have been consulted.    Review of Systems:  General:  Denies fevers, chills, weight loss or gain HEENT:  Denies changes to hearing and vision, rhinorrhea, sinus congestion, sore throat CV:  Denies palpitations, lower extremity edema.  PULM:  Denies SOB, wheezing, cough.   GI:  Denies constipation, diarrhea.   GU:  Denies dysuria, frequency, urgency ENDO:  Denies polyuria, polydipsia.   HEME:  Denies hematemesis, + abnormal bleeding and blood in stools LYMPH:  Denies lymphadenopathy.   MSK:  Denies arthralgias, myalgias.   DERM:  Denies skin rash or ulcer.   NEURO:  Denies focal numbness, weakness, slurred speech, confusion, facial droop.  PSYCH:  Denies anxiety and depression.    Past Medical History  Diagnosis Date  . Coronary artery chronic total occlusion     a. [place] 1991 with totally occluded RCA. No intervention;  b. Lexiscan Cardiolite in (05/2013) showed EF 41%, moderate inferior infarction with minimal peri-infarct ischemia.; c. Myoview (04/2014): EF 44%, inferior infarct, no ischemia, intermediate risk due to size of infarct (med rx cont'd due to no ischemia)   . Paroxysmal a-fib     Diagnosed in 04/2013. TEE-guided cardioversion in 2/15 but went  back into atrial fibrillation. Amiodarone begun and patient went back into NSR.   Marland Kitchen Cardiomyopathy     a. Suspect tachycardia-mediated >> TEE (2/15) with EF 30%, diffuse hypokinesis, moderate MR, mildly decreased RV systolic function; EF 63% by Lexiscan Cardiolite in 3/15;  b.  Echo (07/2013) with EF 50-55%, hypokinesis of the basal to mid inferolateral and inferior  walls.   Marland Kitchen PVC (premature ventricular contraction)   . Kidney stones   . Hypertension   . Hyperlipidemia   . Hard of hearing     hearing aid right ear/hole in left eardrum  . Macular degeneration   . Sinus bradycardia   . Colon polyps     a. Colo 2/16: polys in cecum, sigmoid and desc colon, cecal AVM, sigmoid diverticulosis, int hem's  . H/O endoscopy     a. EGD 2/16:  atrophic gastritis in gastric fundus and gastric body, nodule in gastric cardia (bx performed), o/w normal - Fe def anemia likely from blood loss due to cecal AVM and/or poor Fe absorption from atrophic gastritis    Past Surgical History  Procedure Laterality Date  . Cystoscopy w/ retrogrades  07/19/2003    Double-J stent placement  . Cystoscopy w/ retrogrades  08/08/2001    Double-J stent placement, Stone manipulation, Ureteroscopy  . Appendectomy  1952  . Tonsillectomy      as a child  . Cardiac catheterization  08/02/1989    EF - between 40 and 50%; 100% RCA CTO  . Tee without cardioversion N/A 05/04/2013    Procedure: TRANSESOPHAGEAL ECHOCARDIOGRAM (TEE);  Surgeon: Larey Dresser, MD;  Location: [place];  Service: Cardiovascular;  Laterality: N/A;  . Cardioversion N/A 05/04/2013    Procedure: CARDIOVERSION;  Surgeon: Larey Dresser, MD;  Location: [place];  Service: Cardiovascular;  Laterality: N/A;   Social History:  reports that he quit smoking about 30 years ago. His smoking use included Cigarettes. He has never used smokeless tobacco. He reports that he does not drink alcohol or use illicit drugs. Past[employer] at a [employer]  Allergies  Allergen Reactions  . Codeine Other (See Comments)    dizzy  . Imdur [Isosorbide]     dizzy    Family History  Problem Relation Age of Onset  . Breast cancer Mother   . Heart attack Neg Hx   . Stroke Neg Hx   . Cancer Mother      Prior to Admission medications   Medication Sig Start Date End Date Taking? Authorizing Provider  acetaminophen (TYLENOL) 500  MG tablet Take 500 mg by mouth every 6 (six) hours as needed for moderate pain.   Yes Historical Provider, MD  amiodarone (PACERONE) 200 MG tablet TAKE 1 TABLET TWICE A DAY FOR 1 WEEK THEN 1 TABLET DAILY Patient taking differently: Take 0.5 tablet daily 04/11/14  Yes Larey Dresser, MD  amLODipine (NORVASC) 5 MG tablet Take 1 tablet (5 mg total) by mouth daily. 08/09/13  Yes Larey Dresser, MD  apixaban (ELIQUIS) 2.5 MG TABS tablet Take 1 tablet (2.5 mg total) by mouth 2 (two) times daily. 06/03/14  Yes Scott Joylene Draft, PA-C  Coenzyme Q10 200 MG TABS Take 1 tablet (200 mg total) by mouth daily. 07/18/14  Yes Larey Dresser, MD  isosorbide mononitrate (IMDUR) 30 MG 24 hr tablet Take 30 mg by mouth once.   Yes Historical Provider, MD  metoprolol succinate (TOPROL-XL) 25 MG 24 hr tablet Take 0.5 tablets (12.5 mg total) by mouth daily.  Patient taking differently: Take 25 mg by mouth daily.  11/23/13  Yes Larey Dresser, MD  nitroGLYCERIN (NITROSTAT) 0.4 MG SL tablet Place 1 tablet (0.4 mg total) under the tongue every 5 (five) minutes as needed for chest pain. 05/02/14  Yes Liliane Shi, PA-C  Nutritional Supplements (PROSTA VITE PO) Take 1 tablet by mouth daily.   Yes Historical Provider, MD  pravastatin (PRAVACHOL) 80 MG tablet Take 80 mg by mouth daily.     Yes Historical Provider, MD   Physical Exam: Filed Vitals:   07/27/2014 0820 07/24/2014 0825 08/10/2014 0830 08/07/2014 0910  BP: 112/54 145/59 103/55 108/60  Pulse: 101 98 99 104  Resp: _0 Height:      Weight:      SpO2: 100% 98% 97% 100%   Body mass index is 27.03 kg/(m^2).   General:  Overweight, pale appearing male, no acute distress but serious and uncomfortable appearing on stretch  Eyes:  PERRL, pinpoint, anicteric, non-injected.  ENT:  Nares clear.  OP clear, non-erythematous without plaques or exudates.  MMM.  Neck:  Supple without TM or JVD.    Lymph:  No cervical, supraclavicular, or submandibular  LAD.  Cardiovascular:  RRR, normal S1, possible gallop  2+ pulses, warm extremities  Respiratory:  Rales at bilateral bases, no wheezes or rhonchi, without increased WOB.  Abdomen:  NABS.  Soft, ND/NT.    Skin:  No rashes or focal lesions.  Musculoskeletal:  Normal bulk and tone.  Trace bilateral pitting LE edema.  Psychiatric:  A & O x 4.  Appropriate affect.  Neurologic:  CN 3-12 intact.  5/5 strength.  Sensation intact.  Labs on Admission:  Basic Metabolic Panel:  Recent Labs Lab 08/07/14 1208 08/09/2014 0600  NA 141 141  K 4.4 4.2  CL 107 107  CO2 26 18*  GLUCOSE 90 186*  BUN 32* 49*  CREATININE 2.36* 3.56*  CALCIUM 9.0 9.2   Liver Function Tests:  Recent Labs Lab 08/07/14 1208 07/23/2014 0600  AST 18 28  ALT 10 13*  ALKPHOS 53 43  BILITOT 0.5 1.0  PROT 6.6 6.7  ALBUMIN 4.1 3.9   No results for input(s): LIPASE, AMYLASE in the last 168 hours. No results for input(s): AMMONIA in the last 168 hours. CBC:  Recent Labs Lab 08/07/14 1207 07/23/2014 0600  WBC 1.9* 3.9*  NEUTROABS 0.6*  --   HGB 8.1* 7.2*  HCT 25.3* 22.6*  MCV 105* 102.3*  PLT 68* 91*   Cardiac Enzymes:  Recent Labs Lab 08/15/2014 0731  TROPONINI 0.69*    BNP (last 3 results) No results for input(s): BNP in the last 8760 hours.  ProBNP (last 3 results) No results for input(s): PROBNP in the last 8760 hours.  CBG: No results for input(s): GLUCAP in the last 168 hours.  Radiological Exams on Admission: Dg Chest Port 1 View  08/07/2014   CLINICAL DATA:  Chest pain  EXAM: PORTABLE CHEST - 1 VIEW  COMPARISON:  04/10/2013  FINDINGS: Normal heart size and mediastinal contours for technique. No acute infiltrate or edema. No effusion or pneumothorax. No acute osseous findings.  IMPRESSION: Negative portable chest.   Electronically Signed   By: Monte Fantasia M.D.   On: 07/25/2014 06:37    EKG:  NSR with RBBB, diffuse ST segment depressions throughout  Assessment/Plan Principal  Problem:   NSTEMI (non-ST elevated myocardial infarction) Active Problems:   Hypercholesterolemia   Hypertension   Chronic systolic CHF (congestive  heart failure)   GI bleed   Acute on chronic kidney failure   Pancytopenia  ---  NSTEMI type 1 vs. Type 2 in setting of known CAD.  For the last several weeks it has sounded as if he has had some unstable angina given the increased frequency and severity of his exertional chest pain, however this may be related to his progressive anemia.   His recent stress test demonstrated no evidence of reversible ischemia.  -  Admit to stepdown -  Telemetry -  Cycle troponins and ECG -  A1c & lipid panel -  Aspirin -  Hold Beta blocker for now due to borderline hypotension > defer to cardiology -  Give high dose statin -  NTG gtt and titrate to pain -  Would recommend against heart catheterization given his acute on chronic kidney disease and the likelihood of causing renal failure requiring dialysis plus his ongoing melena and progressive anemia and thrombus cytopenia with platelets as low as 62-68 just a few weeks ago. -  Anticipate cardiology will recommend that medical management -  Blood transfusions as below -  Oxygen  Chronic systolic and diastolic CHF, however, he is developing some rales on exam -  Daily weights and strict I/O -  no ACEI or lasix due to AKI -  BB per cards  PAF with RBBB, sinus rhythm on amiodarone -  Continue amiodarone -  Hold a/c   Acute on chronic kidney disease likely due to dehydration and anemia with metabolic acidosis.  Baseline creatinine is 2.3. Peak creatinine 3.56.   -  UA -  FENa -  Renal US -  Strict I/O and daily weights -  Renally dose medications -  Minimize nephrotoxins and avoid ACEI/ARB  GIB due to gastritis and colonic AVM -  GI consult -  PPI  Pancytopenia being worked up by Dr. Marin Olp -  Anemia panel recently done with normal iron studies, b12, folate -  Check SPEP/UPEP -  Transfuse 2  units given ACS -  Cycle H&H -  Trend platelets and monitor for bleeding -  Dr. Marin Olp notified of admission -  Radiology consult for bone marrow biopsy tomorrow  Diet:  NPO Access:  PIV IVF:  off Proph:  Heparin   Code Status:  Partial:  DNR/DNI, but possibly cardioversion, vasopressors Family Communication: patient and his wife and son Disposition Plan: Admit to stepdown  Time spent: 60 min Janece Canterbury [facility] Pager [phone]  If 7PM-7AM, please contact night-coverage [email or web address] Password [facility] 08/04/2014, 9:19 AM

## 2014-08-12 NOTE — Progress Notes (Signed)
Chaplain responded to Code [employer]. Chaplain provided support for pt's son just outside pt room during the code.

## 2014-08-12 NOTE — Progress Notes (Signed)
PULMONARY / CRITICAL CARE MEDICINE   Name: Christian Gillespie MRN: [id number] DOB: [birth year]    ADMISSION DATE:  08/19/2014 CONSULTATION DATE:  5/23  REFERRING MD :  Sheran Fava   CHIEF COMPLAINT:  Chest pain and hypotension   INITIAL PRESENTATION:  81 year old male w/ sig h/o CAD, AF on Eliquis, cecal AVMs/ sigmoid diverticular disease,chronic pancytopenia, w/ prior FOB +. Presents to ER 5/23 w/ working dx of GIB (hgb drift from from 9.1 to 7.2 in 3 wks)  prob AVMs, w/ resultant demand ischemia/NSTEMI. PCCM asked to see for hypotension.   STUDIES:  ECHO 07/30/13: EF 50-55% hypokinesis of the basal-midinferolateral and inferior myocardium.  SIGNIFICANT EVENTS: 5/23 admitted w/ above 5/23 afternoon in ICU: increased WOB w/ accessory muscle use and rising O2 needs. Required 6 liters to keep sats > 90%, + JVD, new rales. BP labile. Stat echo at bedside suggesting worsening LV fxn. Cards called to bedside. Team deciding on medical rx: IV heparin (aknowledge bleeding risk), vs cardiac cath. Chest pain being the driving issue.   VITAL SIGNS: Temp:  [97.3 F (36.3 C)-97.4 F (36.3 C)] 97.3 F (36.3 C) (05/23 1349) Pulse Rate:  [96-112] 96 (05/23 1500) Resp:  [9-22] 21 (05/23 1500) BP: (80-145)/(45-74) 105/53 mmHg (05/23 1500) SpO2:  [90 %-100 %] 90 % (05/23 1500) Weight:  [77.111 kg (170 lb)] 77.111 kg (170 lb) (05/23 0553) 6 liters  HEMODYNAMICS:   VENTILATOR SETTINGS:   INTAKE / OUTPUT:  Intake/Output Summary (Last 24 hours) at 08/02/2014 1546 Last data filed at 08/10/2014 1143  Gross per 24 hour  Intake    670 ml  Output      0 ml  Net    670 ml    PHYSICAL EXAMINATION: General: 81 year old male, remains remarkably uncomfortable at rest  Neuro:  Awake, alert, no focal def HEENT: [place], + JVD, MMM,  Cardiovascular:  rrr Lungs: now w/ posterior rales  Abdomen:  Soft, non-tender + bowel sounds  Musculoskeletal:  Intact  Skin:  Intact   LABS:  CBC  Recent Labs Lab 08/07/14 1207  08/17/2014 0600 08/17/2014 1307  WBC 1.9* 3.9* 3.2*  HGB 8.1* 7.2* 9.2*  HCT 25.3* 22.6* 29.3*  PLT 68* 91* 79*   Coag's  Recent Labs Lab 08/16/2014 0600  APTT 23*  INR 1.52*   BMET  Recent Labs Lab 08/07/14 1208 07/28/2014 0600  NA 141 141  K 4.4 4.2  CL 107 107  CO2 26 18*  BUN 32* 49*  CREATININE 2.36* 3.56*  GLUCOSE 90 186*   Electrolytes  Recent Labs Lab 08/07/14 1208 08/05/2014 0600  CALCIUM 9.0 9.2   Sepsis Markers  Recent Labs Lab 08/02/2014 1121  LATICACIDVEN 5.5*   ABG No results for input(s): PHART, PCO2ART, PO2ART in the last 168 hours. Liver Enzymes  Recent Labs Lab 08/07/14 1208 08/02/2014 0600  AST 18 28  ALT 10 13*  ALKPHOS 53 43  BILITOT 0.5 1.0  ALBUMIN 4.1 3.9   Cardiac Enzymes  Recent Labs Lab 07/29/2014 0731  TROPONINI 0.69*   Glucose  Recent Labs Lab 08/03/2014 1343  GLUCAP 182*    Imaging No results found.   ASSESSMENT / PLAN:  PULMONARY OETT A: Acute Hypoxic respiratory failure Still SOB, O2 sats < 90 on 2 liters.  P:   Supplemental oxygen  Wean FIO2 as needed   CARDIOVASCULAR CVL A:  NSTEMI/ demand ischemia  Chest pain  H/o AF on Eliquis  Hypotension  New acute decompensated systolic  HF: ECHO prelim suggesting decreased LV fxn c/w prior exam.  P:  F/u lactic acid  Morphine for comfort  Tele Hold antihypertensives  Cards deciding on cardiac cath vs medical rx w/ heparin gtt (note bleeding risk) If no cath will need PAC.   RENAL A:   AKI/ acute on chronic renal failure (baseline creatinine 0.93) Metabolic acidosis + gap acidosis --appears volume resuscitated  P:   Hold antihypertensive Renal dose meds  Strict I&O KVO fluid now Repeat lactic acid   GASTROINTESTINAL A:   Chronic GIB w/ h/o cecal AVMs and diverticular disease -->on eliquis  P:   NPO  PPI  Stop Eliquis for now  HEMATOLOGIC A:   Acute on chronic GIB in setting of gastric AVMs; on Eliquis-->seemingly more chronic  P:  Cont  PPI  Transfuse for Hgb <8 PAS  May need to consider IV anticoagulation accepting risk of bleeding (see above)   INFECTIOUS A:   No evidence of infection  P:   Trend fever and WBC curve   ENDOCRINE A:   Hyperglycemia  P:   ssi if > 180   NEUROLOGIC A:  No acute  P:   Supportive care    FAMILY  - Updates: at bedside 5/23  - Inter-disciplinary family meet or Palliative Care meeting due by:  5/30     TODAY'S SUMMARY: 80 year old male admitted w/ demand ischemia in setting of slow GIB from AVMs. Still symptomatic. afternoon in ICU: increased WOB w/ accessory muscle use and rising O2 needs. Required 6 liters to keep sats > 90%, + JVD, new rales. BP labile. Stat echo at bedside suggesting worsening LV fxn. Cards called to bedside. Team deciding on medical rx: IV heparin (aknowledge bleeding risk), vs cardiac cath. Chest pain being the driving issue.   Erick Colace ACNP-BC [place] Pager # [phone] OR # [phone] if no answer   07/22/2014, 3:46 PM

## 2014-08-12 NOTE — ED Notes (Signed)
Per MD Short, bolus in RBC's at this time. Give 200 mg amiodorone from pharmacy.

## 2014-08-12 NOTE — ED Provider Notes (Signed)
CSN: [id number]     Arrival date & time 08/19/2014  0544 History   None    Chief Complaint  Patient presents with  . Chest Pain     (Consider location/radiation/quality/duration/timing/severity/associated sxs/prior Treatment) HPI  Patient has a history coronary artery disease with atrial fibrillation and cardiomyopathy. He reports off and on for the past week he has been getting exertional chest pain. However last night he started getting the chest discomfort that is lasted all night long. He states the pain is diffusely across his chest and radiates between his shoulder blades. He describes the pain is aching. He has had nausea and vomited twice. He denies any diaphoresis. He states he has mild shortness of breath. He denies having black stools or bright red blood per rectum. He states he feels weak this week but denies dizziness. Patient states he took nitroglycerin sublingual 4 at home with only brief improvement of his pain.  He reports he has an appointment for hematologist and he is supposed to have a bone marrow aspirate done tomorrow. He reports he has been anemic and his white blood cell count is below. He also states he's recently had an upper GI and lower GI which he states were normal.  PCP Dr Harrington [facility] Dr Aundra Dubin Hematology Dr Marin Olp to get bone marrow aspirate done tomorrow  Past Medical History  Diagnosis Date  . CAD (coronary artery disease)     a. [place] 1991 with totally occluded RCA. No intervention;  b. Lexiscan Cardiolite in (3/15) showed EF 41%, moderate inferior infarction with minimal peri-infarct ischemia.; c. Myoview (2/16): EF 44%, inferior infarct, no ischemia, intermediate risk (med rx cont'd)   . Atrial fibrillation     Diagnosed in 2/15. TEE-guided cardioversion in 2/15 but went back into atrial fibrillation. Amiodarone begun and patient went back into NSR.   Marland Kitchen Cardiomyopathy     a. Suspect tachycardia-mediated >> TEE (2/15) with EF 30%, diffuse  hypokinesis, moderate MR, mildly decreased RV systolic function; EF 81% by Lexiscan Cardiolite in 3/15;  b.  Echo (5/15) with EF 50-55%, hypokinesis of the basal to mid inferolateral and inferior walls.   Marland Kitchen PVC (premature ventricular contraction)   . Kidney stones   . Hypertension   . Hyperlipidemia   . Hard of hearing     hearing aid right ear/hole in left eardrum  . Macular degeneration   . Sinus bradycardia   . Hx of cardiovascular stress test     Lexiscan Myoview (2/16): Inferior scar, no ischemia, EF 44%; intermediate risk  . Colon polyps     a. Colo 2/16: polys in cecum, sigmoid and desc colon, cecal AVM, sigmoid diverticulosis, int hem's  . H/O endoscopy     a. EGD 2/16:  atrophic gastritis in gastric fundus and gastric body, nodule in gastric cardia (bx performed), o/w normal - Fe def anemia likely from blood loss due to cecal AVM and/or poor Fe absorption from atrophic gastritis    Past Surgical History  Procedure Laterality Date  . Cystoscopy w/ retrogrades  07/19/2003    Double-J stent placement  . Cystoscopy w/ retrogrades  08/08/2001    Double-J stent placement, Stone manipulation, Ureteroscopy  . Appendectomy  1952  . Tonsillectomy      as a child  . Cardiac catheterization  08/02/1989    EF - between 40 and 50%  . Tee without cardioversion N/A 05/04/2013    Procedure: TRANSESOPHAGEAL ECHOCARDIOGRAM (TEE);  Surgeon: Larey Dresser, MD;  Location: [facility]  ENDOSCOPY;  Service: Cardiovascular;  Laterality: N/A;  . Cardioversion N/A 05/04/2013    Procedure: CARDIOVERSION;  Surgeon: Larey Dresser, MD;  Location: [facility] ENDOSCOPY;  Service: Cardiovascular;  Laterality: N/A;   Family History  Problem Relation Age of Onset  . Breast cancer Mother   . Heart attack Neg Hx   . Stroke Neg Hx   . Cancer Mother    History  Substance Use Topics  . Smoking status: Former Smoker    Types: Cigarettes    Quit date: 04/09/1984  . Smokeless tobacco: Never Used     Comment: quit 26yeras  ago  . Alcohol Use: No   lives at home Lives with spouse  Review of Systems  All other systems reviewed and are negative.     Allergies  Codeine and Imdur  Home Medications   Prior to Admission medications   Medication Sig Start Date End Date Taking? Authorizing Provider  amiodarone (PACERONE) 200 MG tablet TAKE 1 TABLET TWICE A DAY FOR 1 WEEK THEN 1 TABLET DAILY 04/11/14   Larey Dresser, MD  amLODipine (NORVASC) 5 MG tablet Take 1 tablet (5 mg total) by mouth daily. 08/09/13   Larey Dresser, MD  apixaban (ELIQUIS) 2.5 MG TABS tablet Take 1 tablet (2.5 mg total) by mouth 2 (two) times daily. 06/03/14   Liliane Shi, PA-C  Coenzyme Q10 200 MG TABS Take 1 tablet (200 mg total) by mouth daily. 07/18/14   Larey Dresser, MD  metoprolol succinate (TOPROL-XL) 25 MG 24 hr tablet Take 0.5 tablets (12.5 mg total) by mouth daily. 11/23/13   Larey Dresser, MD  nitroGLYCERIN (NITROSTAT) 0.4 MG SL tablet Place 1 tablet (0.4 mg total) under the tongue every 5 (five) minutes as needed for chest pain. 05/02/14   Liliane Shi, PA-C  Nutritional Supplements (PROSTA VITE PO) Take 1 tablet by mouth daily.    Historical Provider, MD  pravastatin (PRAVACHOL) 80 MG tablet Take 80 mg by mouth daily.      Historical Provider, MD   BP 117/62 mmHg  Pulse 105  Resp 14  Ht 5' 6.5" (1.689 m)  Wt 170 lb (77.111 kg)  BMI 27.03 kg/m2  SpO2 100%  Vital signs normal except for tachycardia    Physical Exam  Constitutional: He is oriented to person, place, and time. He appears well-developed and well-nourished.  Non-toxic appearance. He does not appear ill. No distress.  HENT:  Head: Normocephalic and atraumatic.  Right Ear: External ear normal.  Left Ear: External ear normal.  Nose: Nose normal. No mucosal edema or rhinorrhea.  Mouth/Throat: Oropharynx is clear and moist and mucous membranes are normal. No dental abscesses or uvula swelling.  Mucous membranes pale  Eyes: Conjunctivae and EOM are  normal. Pupils are equal, round, and reactive to light.  Pale conjunctiva  Neck: Normal range of motion and full passive range of motion without pain. Neck supple.  Cardiovascular: Normal rate, regular rhythm and normal heart sounds.  Exam reveals no gallop and no friction rub.   No murmur heard. Pulmonary/Chest: Effort normal and breath sounds normal. No respiratory distress. He has no wheezes. He has no rhonchi. He has no rales. He exhibits no tenderness and no crepitus.  Abdominal: Soft. Normal appearance and bowel sounds are normal. He exhibits no distension. There is no tenderness. There is no rebound and no guarding.  Musculoskeletal: Normal range of motion. He exhibits no edema or tenderness.  Moves all extremities well.  Neurological: He is alert and oriented to person, place, and time. He has normal strength. No cranial nerve deficit.  Skin: Skin is warm, dry and intact. No rash noted. No erythema. There is pallor.  Psychiatric: He has a normal mood and affect. His speech is normal and behavior is normal. His mood appears not anxious.  Nursing note and vitals reviewed.   ED Course  Procedures (including critical care time) Medications  0.9 %  sodium chloride infusion ( Intravenous New Bag/Given 08/05/2014 0616)  aspirin chewable tablet 324 mg (324 mg Oral Given 08/06/2014 0615)  morphine 4 MG/ML injection 4 mg (4 mg Intravenous Given 08/09/2014 0624)  nitroGLYCERIN 50 mg in dextrose 5 % 250 mL (0.2 mg/mL) infusion (15 mcg/min Intravenous Rate/Dose Change 08/01/2014 0719)  ondansetron (ZOFRAN) injection 4 mg (4 mg Intravenous Given 07/26/2014 0622)    Patient was given aspirin to chew and given morphine for pain. He was started on a nitroglycerin drip to be titrated to pain relief.  6:20 AM patient and family given the results of his troponin and need for admission.  Recheck at 7:10 AM patient on nitroglycerin drip. He has had morphine for pain. He states his pain is in improved mildly but  still present. The nursing staff for adjusting his nitroglycerin rate. Still waiting for cardiology to return call.  Patient left with Dr. Darl Householder at 7:55 AM. Cardiology has not called back an hour and 20 minutes. Patient was prepared for transfusion.   Review of his cardiologist's note in February. Patient was referred to GI for worsening anemia. His Hemoccult was positive. He had a colonoscopy which showed polyps which were removed and he had cecal AVMs. On endoscopy he was noted to have gastritis and a gastric nodule which was biopsy that was negative for malignant cells. Patient had a cardiac cath in 1991 showing a totally occluded RCA and no intervention was done at that time. In February 2015 he was in atrial fibrillation with rapid ventricular response. He had a TEE guided cardioversion done. He did convert to normal sinus rhythm. He was started on amiodarone. The TEE showed ejection fraction of 30% with diffuse hypokinesis and moderate MR. Later he was found to be back in atrial fibrillation and his amiodarone to round dose was increased and he did convert back to normal sinus rhythm without having cardioversion done. When he was seen in February he was complaining of exertional arm pain and pain between his shoulder blades with exertion over the past couple weeks.  Lexiscan without exercise stress test Overall Impression: Intermediate risk stress nuclear study with a medium sized, severe in intensity fixed defect consistent with prior infarct from the base to the apex with no evidence of ischemia. .  LV Ejection Fraction: 44%. LV Wall Motion: Mildly reduced LVF   Signed: Fransico Him, MD [facility] 05/09/2014  Labs Review Results for orders placed or performed during the hospital encounter of 08/20/2014  APTT  Result Value Ref Range   aPTT 23 (L) 24 - 37 seconds  CBC  Result Value Ref Range   WBC 3.9 (L) 4.0 - 10.5 K/uL   RBC 2.21 (L) 4.22 - 5.81 MIL/uL   Hemoglobin 7.2 (L) 13.0 -  17.0 g/dL   HCT 22.6 (L) 39.0 - 52.0 %   MCV 102.3 (H) 78.0 - 100.0 fL   MCH 32.6 26.0 - 34.0 pg   MCHC 31.9 30.0 - 36.0 g/dL   RDW 16.7 (H) 11.5 - 15.5 %  Platelets 91 (L) 150 - 400 K/uL  Comprehensive metabolic panel  Result Value Ref Range   Sodium 141 135 - 145 mmol/L   Potassium 4.2 3.5 - 5.1 mmol/L   Chloride 107 101 - 111 mmol/L   CO2 18 (L) 22 - 32 mmol/L   Glucose, Bld 186 (H) 65 - 99 mg/dL   BUN 49 (H) 6 - 20 mg/dL   Creatinine, Ser 3.56 (H) 0.61 - 1.24 mg/dL   Calcium 9.2 8.9 - 10.3 mg/dL   Total Protein 6.7 6.5 - 8.1 g/dL   Albumin 3.9 3.5 - 5.0 g/dL   AST 28 15 - 41 U/L   ALT 13 (L) 17 - 63 U/L   Alkaline Phosphatase 43 38 - 126 U/L   Total Bilirubin 1.0 0.3 - 1.2 mg/dL   GFR calc non Af Amer 14 (L) >60 mL/min   GFR calc Af Amer 16 (L) >60 mL/min   Anion gap 16 (H) 5 - 15  Protime-INR  Result Value Ref Range   Prothrombin Time 18.3 (H) 11.6 - 15.2 seconds   INR 1.52 (H) 0.00 - 1.49  I-Stat Troponin, ED (not at [facility])  Result Value Ref Range   Troponin i, poc 0.23 (HH) 0.00 - 0.08 ng/mL   Comment NOTIFIED PHYSICIAN    Comment 3          POC occult blood, ED RN will collect  Result Value Ref Range   Fecal Occult Bld POSITIVE (A) NEGATIVE  Sample to Blood Bank  Result Value Ref Range   Blood Bank Specimen SAMPLE AVAILABLE FOR TESTING    Sample Expiration 09/03/14      Laboratory interpretation all normal except + troponin  Imaging Review Dg Chest Port 1 View  07/29/2014   CLINICAL DATA:  Chest pain  EXAM: PORTABLE CHEST - 1 VIEW  COMPARISON:  04/10/2013  FINDINGS: Normal heart size and mediastinal contours for technique. No acute infiltrate or edema. No effusion or pneumothorax. No acute osseous findings.  IMPRESSION: Negative portable chest.   Electronically Signed   By: Monte Fantasia M.D.   On: 07/31/2014 06:37     EKG Interpretation   Date/Time:  Monday Aug 12 2014 05:51:52 EDT Ventricular Rate:  106 PR Interval:  99 QRS Duration:  187 QT Interval:  439 QTC Calculation: 583 R Axis:   155 Text Interpretation:  Sinus tachycardia Atrial premature complex RBBB and  LPFB Since last tracing 24 May 2013 Repol abnrm, severe global ischemia  (LM/MVD) Confirmed by Elisia Stepp  MD-I, Laikyn Gewirtz ([id number]) on 07/23/2014 5:57:43 AM      MDM   Final diagnoses:  Ischemic chest pain  NSTEMI (non-ST elevated myocardial infarction)  Anemia, unspecified anemia type  Thrombocytopenia  Renal insufficiency   Plan admission  Rolland Porter, MD, FACEP   CRITICAL CARE Performed by: Rolland Porter L Total critical care time: 40 min Critical care time was exclusive of separately billable procedures and treating other patients. Critical care was necessary to treat or prevent imminent or life-threatening deterioration. Critical care was time spent personally by me on the following activities: development of treatment plan with patient and/or surrogate as well as nursing, discussions with consultants, evaluation of patient's response to treatment, examination of patient, obtaining history from patient or surrogate, ordering and performing treatments and interventions, ordering and review of laboratory studies, ordering and review of radiographic studies, pulse oximetry and re-evaluation of patient's condition.    Rolland Porter, MD 08/11/2014 [phone]

## 2014-08-13 ENCOUNTER — Inpatient Hospital Stay (HOSPITAL_COMMUNITY): Payer: Medicare Other

## 2014-08-13 ENCOUNTER — Ambulatory Visit: Payer: Medicare Other | Admitting: Hematology & Oncology

## 2014-08-13 ENCOUNTER — Ambulatory Visit (HOSPITAL_COMMUNITY): Payer: Medicare Other

## 2014-08-13 ENCOUNTER — Encounter (HOSPITAL_COMMUNITY): Payer: Self-pay | Admitting: Cardiovascular Disease

## 2014-08-13 ENCOUNTER — Other Ambulatory Visit: Payer: Medicare Other

## 2014-08-13 LAB — TYPE AND SCREEN
ABO/RH(D): A POS
Antibody Screen: NEGATIVE
UNIT DIVISION: 0
Unit division: 0

## 2014-08-13 LAB — URINE CULTURE
COLONY COUNT: NO GROWTH
CULTURE: NO GROWTH
SPECIAL REQUESTS: NORMAL

## 2014-08-13 LAB — POCT I-STAT 3, ART BLOOD GAS (G3+)
ACID-BASE DEFICIT: 22 mmol/L — AB (ref 0.0–2.0)
Bicarbonate: 8 mEq/L — ABNORMAL LOW (ref 20.0–24.0)
O2 Saturation: 92 %
PO2 ART: 94 mmHg (ref 80.0–100.0)
TCO2: 9 mmol/L (ref 0–100)
pCO2 arterial: 30.8 mmHg — ABNORMAL LOW (ref 35.0–45.0)
pH, Arterial: 7.021 — CL (ref 7.350–7.450)

## 2014-08-13 LAB — HEMOGLOBIN A1C
HEMOGLOBIN A1C: 5.5 % (ref 4.8–5.6)
MEAN PLASMA GLUCOSE: 111 mg/dL

## 2014-08-13 LAB — GLUCOSE, CAPILLARY: Glucose-Capillary: 232 mg/dL — ABNORMAL HIGH (ref 65–99)

## 2014-08-13 LAB — LACTIC ACID, PLASMA: Lactic Acid, Venous: 16.5 mmol/L (ref 0.5–2.0)

## 2014-08-13 MED FILL — Heparin Sodium (Porcine) 2 Unit/ML in Sodium Chloride 0.9%: INTRAMUSCULAR | Qty: 1000 | Status: AC

## 2014-08-13 MED FILL — Medication: Qty: 1 | Status: AC

## 2014-08-14 ENCOUNTER — Telehealth: Payer: Self-pay

## 2014-08-14 NOTE — Telephone Encounter (Signed)
On 08/14/2014 I received a death certificate from [employer]. This patient is a patient of Doctor Sood. This certificate is for burial. This certicate was taken to e-link yesterday (2014/08/19). The certificate was picked up from [facility] for Doctor [name] to sign. I got the certificate ready and I called the funeral home to let them know it was ready for pickup.

## 2014-08-21 NOTE — Progress Notes (Signed)
Time of death was 00:40.  No heart tones ascultated.  [place] doctor notified.  Family notified.  [facility] contacted.

## 2014-08-21 NOTE — Progress Notes (Signed)
At time of death 00:40, no palpable pulses, no apical heart beat ascultated, pupils unreactive, and rhythm asystole.  All confirmed by Norva Pavlov, RN and Elray Buba, RN.

## 2014-08-21 NOTE — Progress Notes (Signed)
Chaplain responded to call from 40M to support family as they process pt's poor prognosis. Spoke with nurse prior to seeing family. Catheterization showed major blockage, and surgery is not an option. Spent time with pt's wife, son, and other family members in third floor waiting area. 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 They appreciated chaplain support and conversation.

## 2014-08-21 NOTE — Progress Notes (Signed)
255ml of fentanyl and 66ml of versed wasted in room sink.  Witnessed by Aldona Lento, RN.

## 2014-08-21 NOTE — Progress Notes (Signed)
Chaplain was asked by Dr. Ellyn Hack to join him in consult room as he conferred with pt's family. He presented options and pt's wife opted for catheterization. Chaplain provided emotional and spiritual support for pt's wife and son.

## 2014-08-21 NOTE — Progress Notes (Signed)
Mrs. Jaquavian Firkus (spouse) declined autopsy, and requested the use of [facility].  Her home address for further contact is [address] [place] [address]

## 2014-08-21 DEATH — deceased

## 2014-08-30 ENCOUNTER — Encounter (INDEPENDENT_AMBULATORY_CARE_PROVIDER_SITE_OTHER): Payer: Medicare Other | Admitting: Ophthalmology

## 2014-09-05 NOTE — Discharge Summary (Signed)
Christian Gillespie was a 81 y.o. male admitted on 08/07/2014 with exertional chest pain.  He also had blood in his stool with significant anemia.  He had elevated troponin.  Concern was for demand ischemia in setting of anemia and GI bleed.  He developed hypotension while on nitroglycerin.  He was given transfusion.  Cardiology was consulted.  He had persistent chest pain.  He developed respiratory distress and required BiPAP.  He then developed PEA cardiac arrest.  He was DNR, but wife decided to proceed with aggressive measures.  He was intubated, and CPR started.  He was resuscitate, and started on pressors.  He was being assessed for cardiac catheterization, and this showed critical left main CAD, and he was not a candidate for percutaneous or surgical intervention.  He developed recurrent cardiac arrest with ventricular tachycardia.  Family at that time requested not to pursue cardiac resuscitation.  He subsequently expired on 07/29/2014.  Final diagnoses: PEA cardiac arrest Acute respiratory failure Cardiogenic shock Demand ischemia CAD Moderate to severe mitral regurgitation Acute on chronic systolic, diastolic heart failure Hx of hypertension Hx of hyperlipidemia Acute kidney injury Stage 4 CKD Hyperkalemia Anion gap metabolic acidosis Lactic acidosis Respiratory acidosis Acute on chronic lower GI bleeding with hx of cecal AVMs and diverticulosis Paroxysmal atrial fibrillation on eliquis as outpt Hx of pancytopenia  Chesley Mires, MD [place] 09/05/2014, 10:56 AM

## 2016-03-22 IMAGING — CR DG ABD PORTABLE 1V
1 series · 1 of 1 positions shown · non-contrast
Comparison: Chest x-ray from earlier the same day

CLINICAL DATA: Orogastric tube placement.

EXAM:
PORTABLE ABDOMEN - 1 VIEW

[AP]
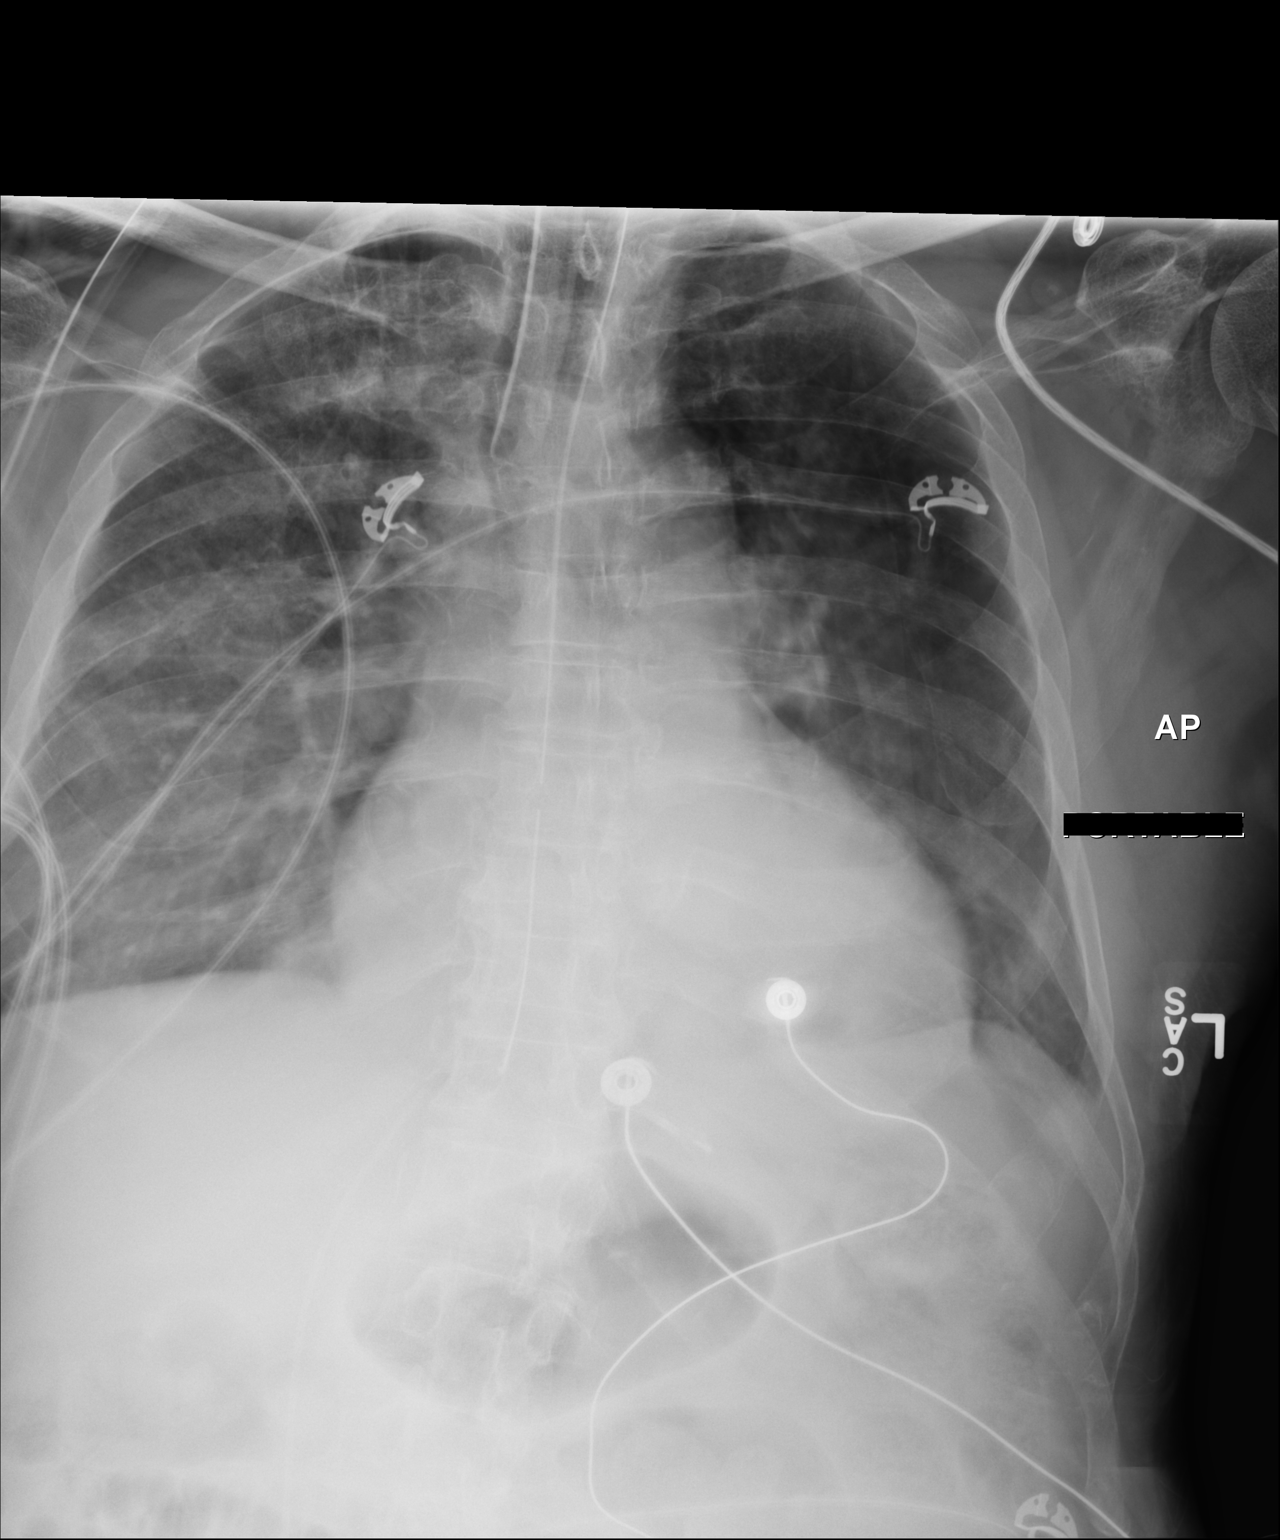

[1 of 1 positions shown; findings below may reference images not displayed]

FINDINGS: Endotracheal tube remains in good position, tip between the
clavicular heads and carina. New orogastric tube with tip at the GE
junction. 11 cm advancement would be required to place the side port
in the proximal stomach. There is a temporary pacer wire overlapping
the right ventricle from below.

Mild cardiomegaly, accentuated by technique. Haziness of the
bilateral chest compatible with layering fluid and atelectasis. No
air leak.

These results were called by telephone at the time of interpretation
on 08/12/2014 at [DATE] to RN Jumper, who verbally acknowledged these
results.
IMPRESSION: 1. Short orogastric tube. 11 cm advancement would be required to
place the side port in the upper stomach.
2. Temporary pacer wire into the right ventricle.
3. Unchanged haziness of the chest, likely layering pleural
effusion, atelectasis, and edema.

## 2016-03-22 IMAGING — CR DG CHEST 1V PORT
1 series · 1 of 1 positions shown · non-contrast
Comparison: 04/10/2013

CLINICAL DATA: Chest pain

EXAM:
PORTABLE CHEST - 1 VIEW

[AP]
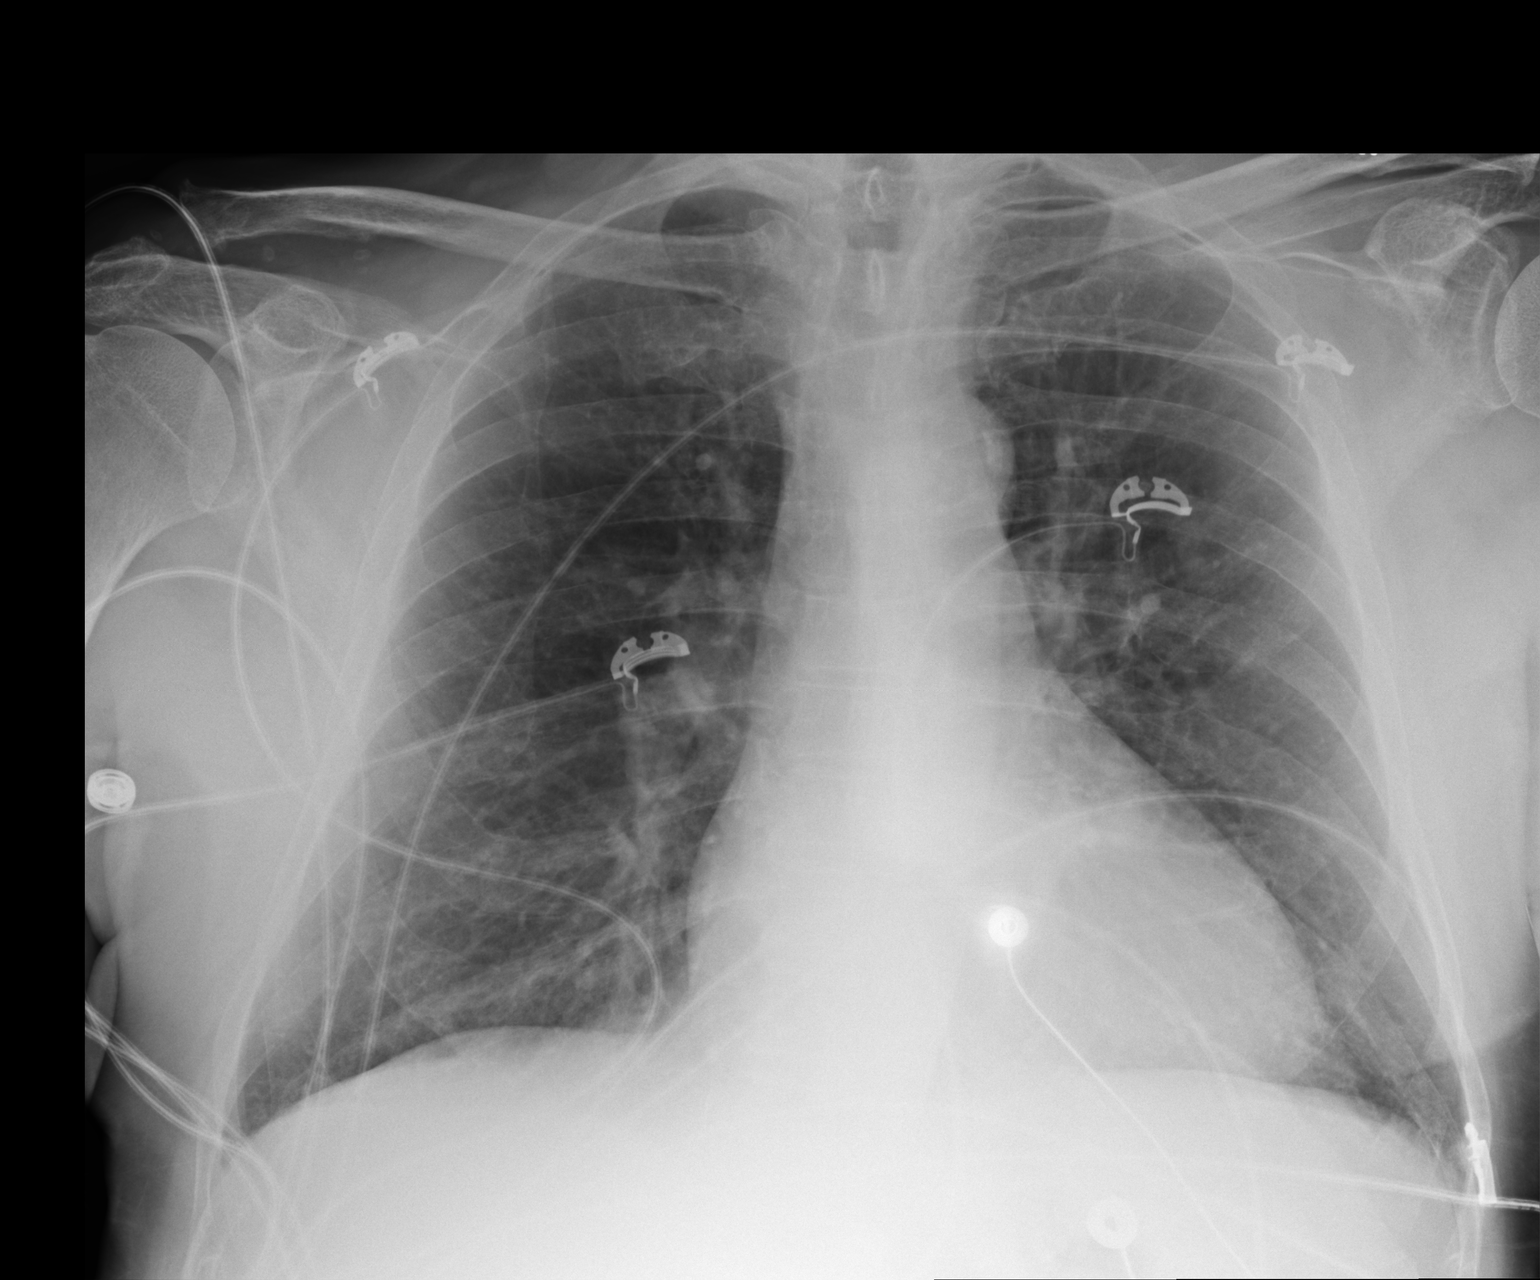

[1 of 1 positions shown; findings below may reference images not displayed]

FINDINGS: Normal heart size and mediastinal contours for technique. No acute
infiltrate or edema. No effusion or pneumothorax. No acute osseous
findings.
IMPRESSION: Negative portable chest.

## 2016-03-22 IMAGING — CR DG CHEST 1V PORT
1 series · 1 of 1 positions shown · non-contrast
Comparison: 08/12/2014

CLINICAL DATA: Central line placement and endotracheal tube
placement.

EXAM:
PORTABLE CHEST - 1 VIEW

[AP]
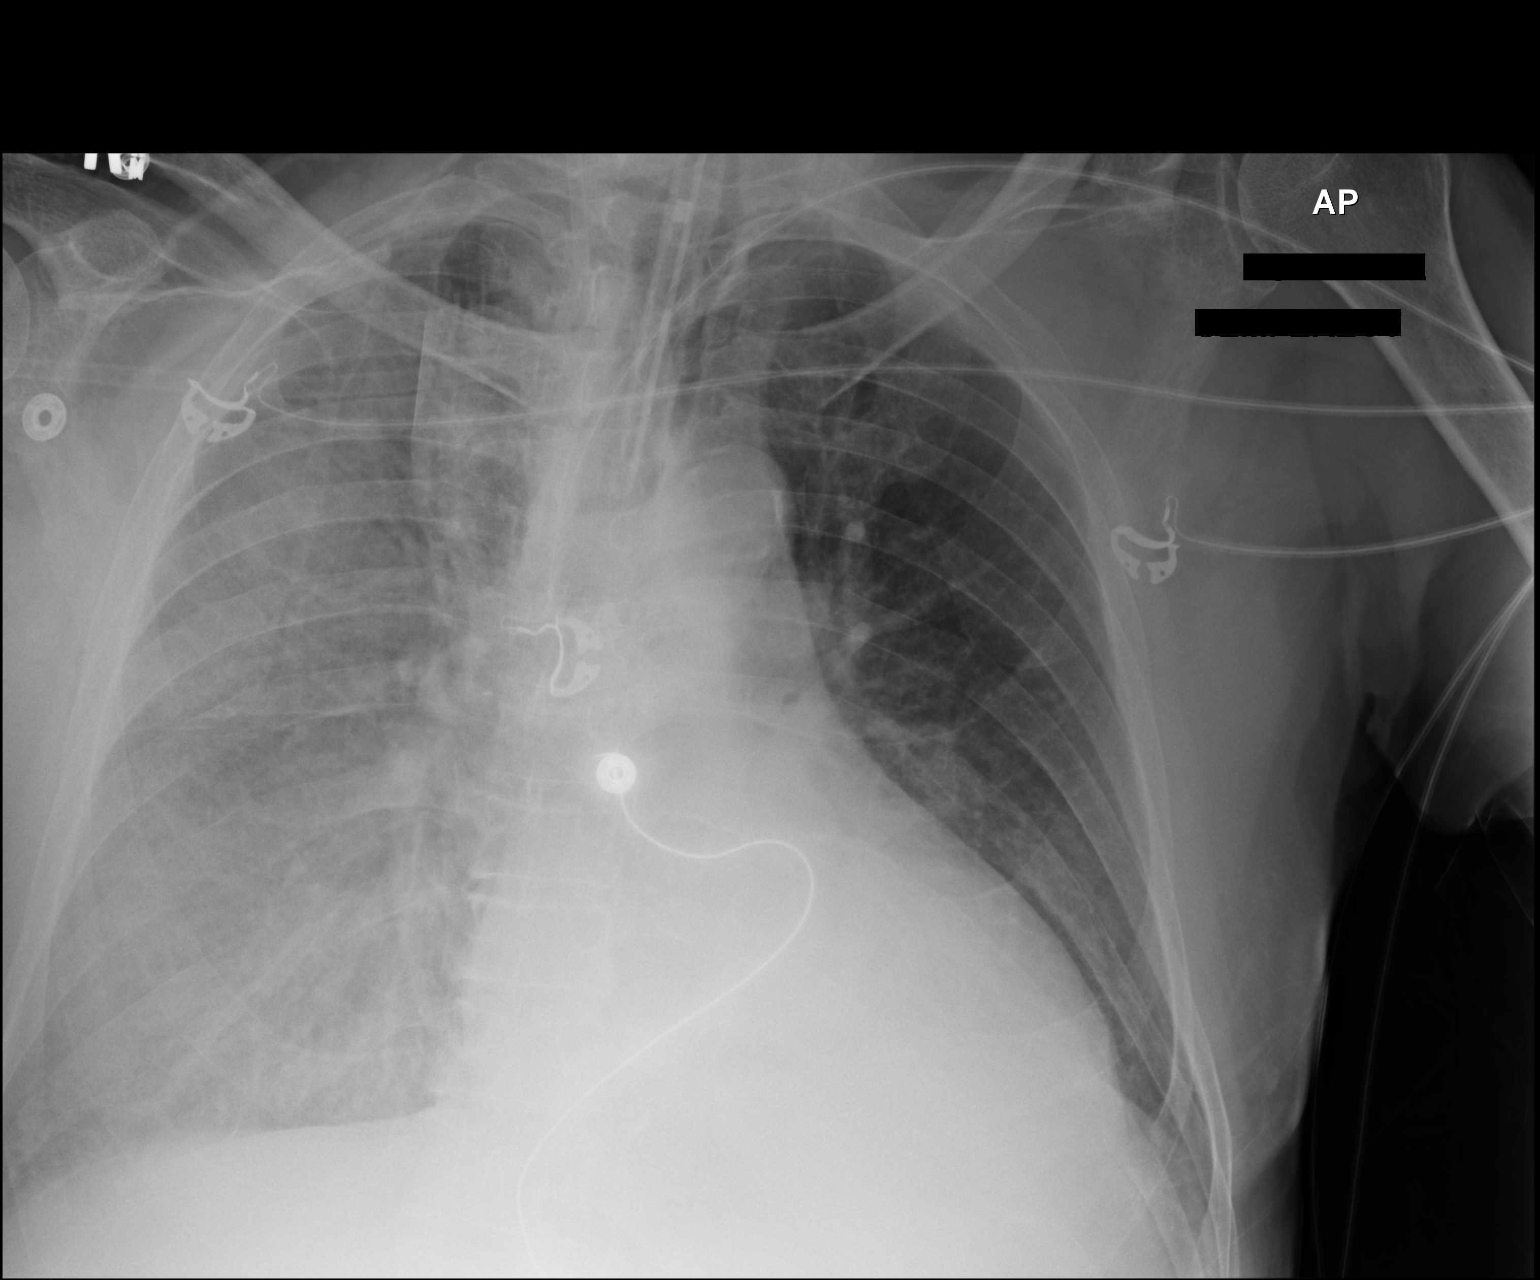

[1 of 1 positions shown; findings below may reference images not displayed]

FINDINGS: The endotracheal tube is 3.2 cm above the carina. There is a right
IJ central venous Cordis in the proximal SVC. Worsening airspace
process in the right lung could be asymmetric pulmonary edema or
worsening right lung infiltrate. No definite pleural effusion.
IMPRESSION: The endotracheal tube is 3.2 cm above the carina.

Right IJ central venous Cordis is in the proximal SVC.

Worsening right lung aeration.

## 2016-03-22 IMAGING — CR DG CHEST 1V PORT
1 series · 1 of 1 positions shown · non-contrast
Comparison: August 12, 2014

CLINICAL DATA: Shortness of breath and hypotension

EXAM:
PORTABLE CHEST - 1 VIEW

[AP]
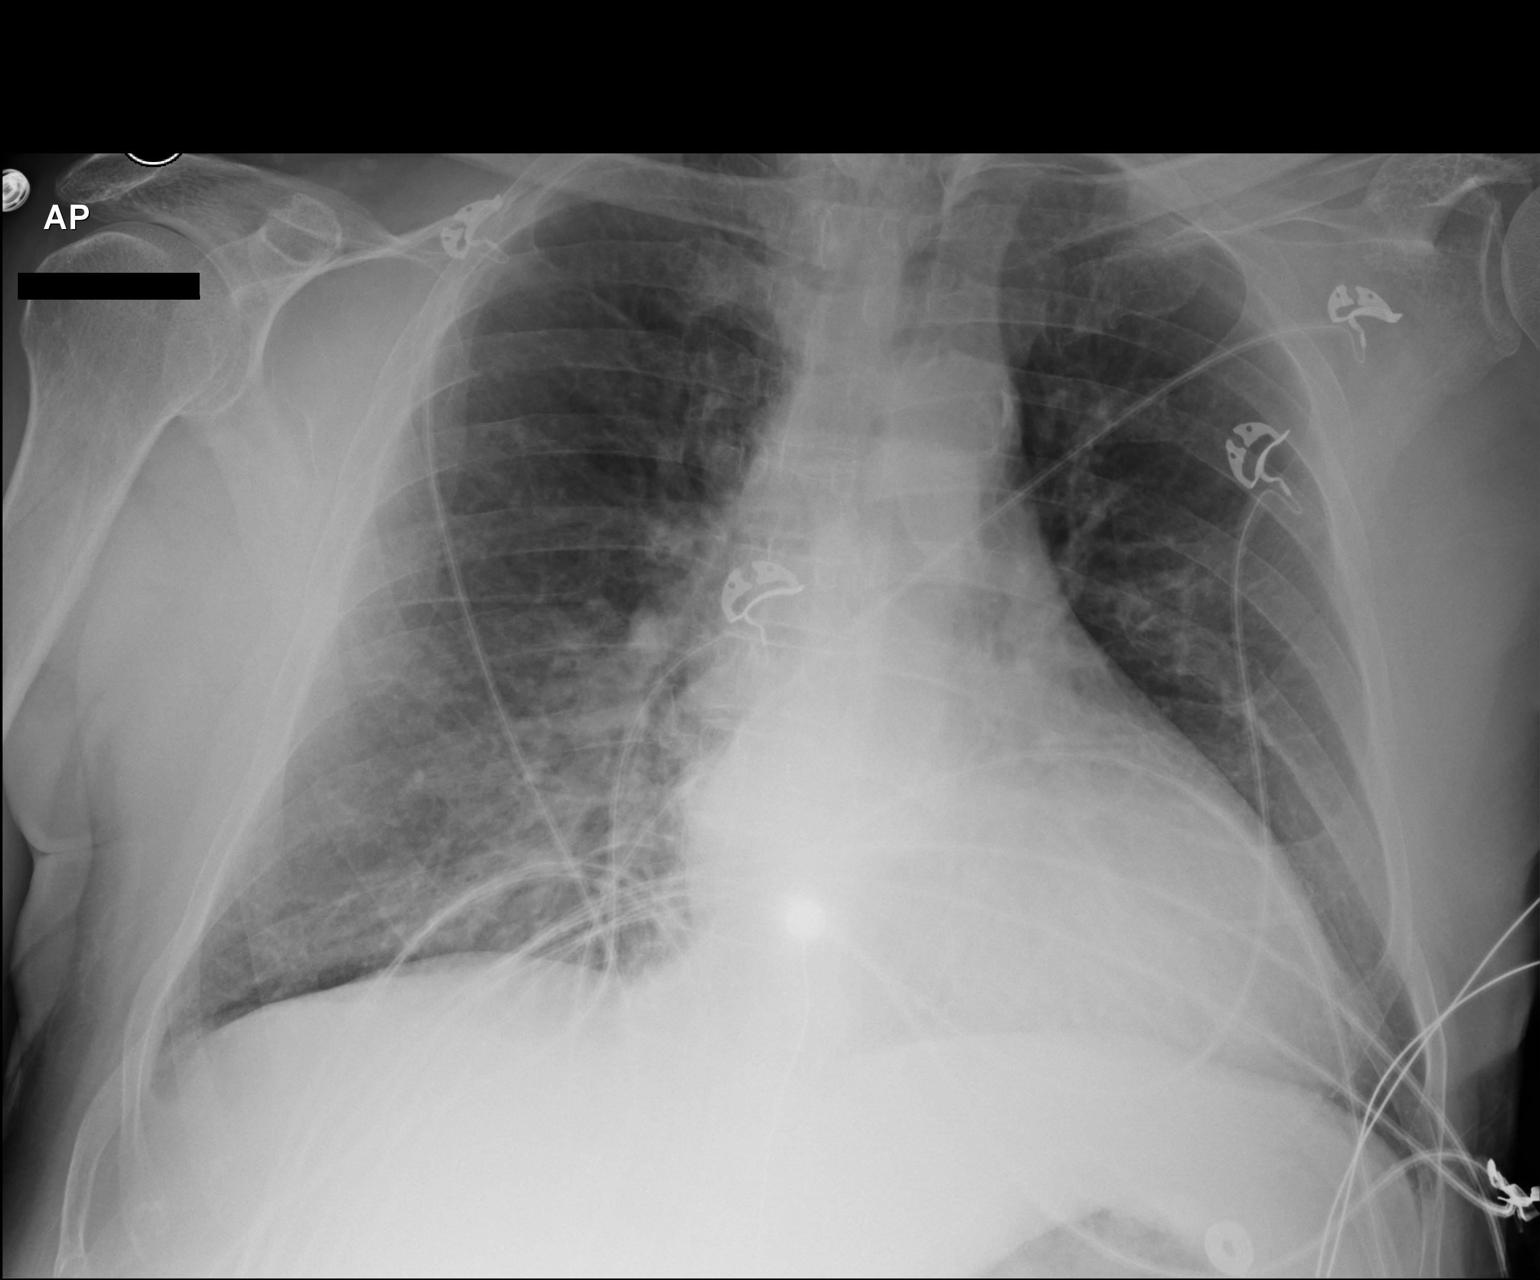

[1 of 1 positions shown; findings below may reference images not displayed]

FINDINGS: There is now ill-defined opacity in the right lower lobe concerning
for early pneumonia. Lungs elsewhere clear. Heart is upper normal in
size with pulmonary vascularity within normal limits. No adenopathy.
No bone lesions.
IMPRESSION: Hazy opacity in the right base suspicious for early pneumonia. Lungs
elsewhere clear.

## 2016-03-23 IMAGING — CR DG ABD PORTABLE 1V
1 series · 1 of 1 positions shown · non-contrast
Comparison: Earlier same day

CLINICAL DATA: OG tube placement

EXAM:
PORTABLE ABDOMEN - 1 VIEW

[AP]
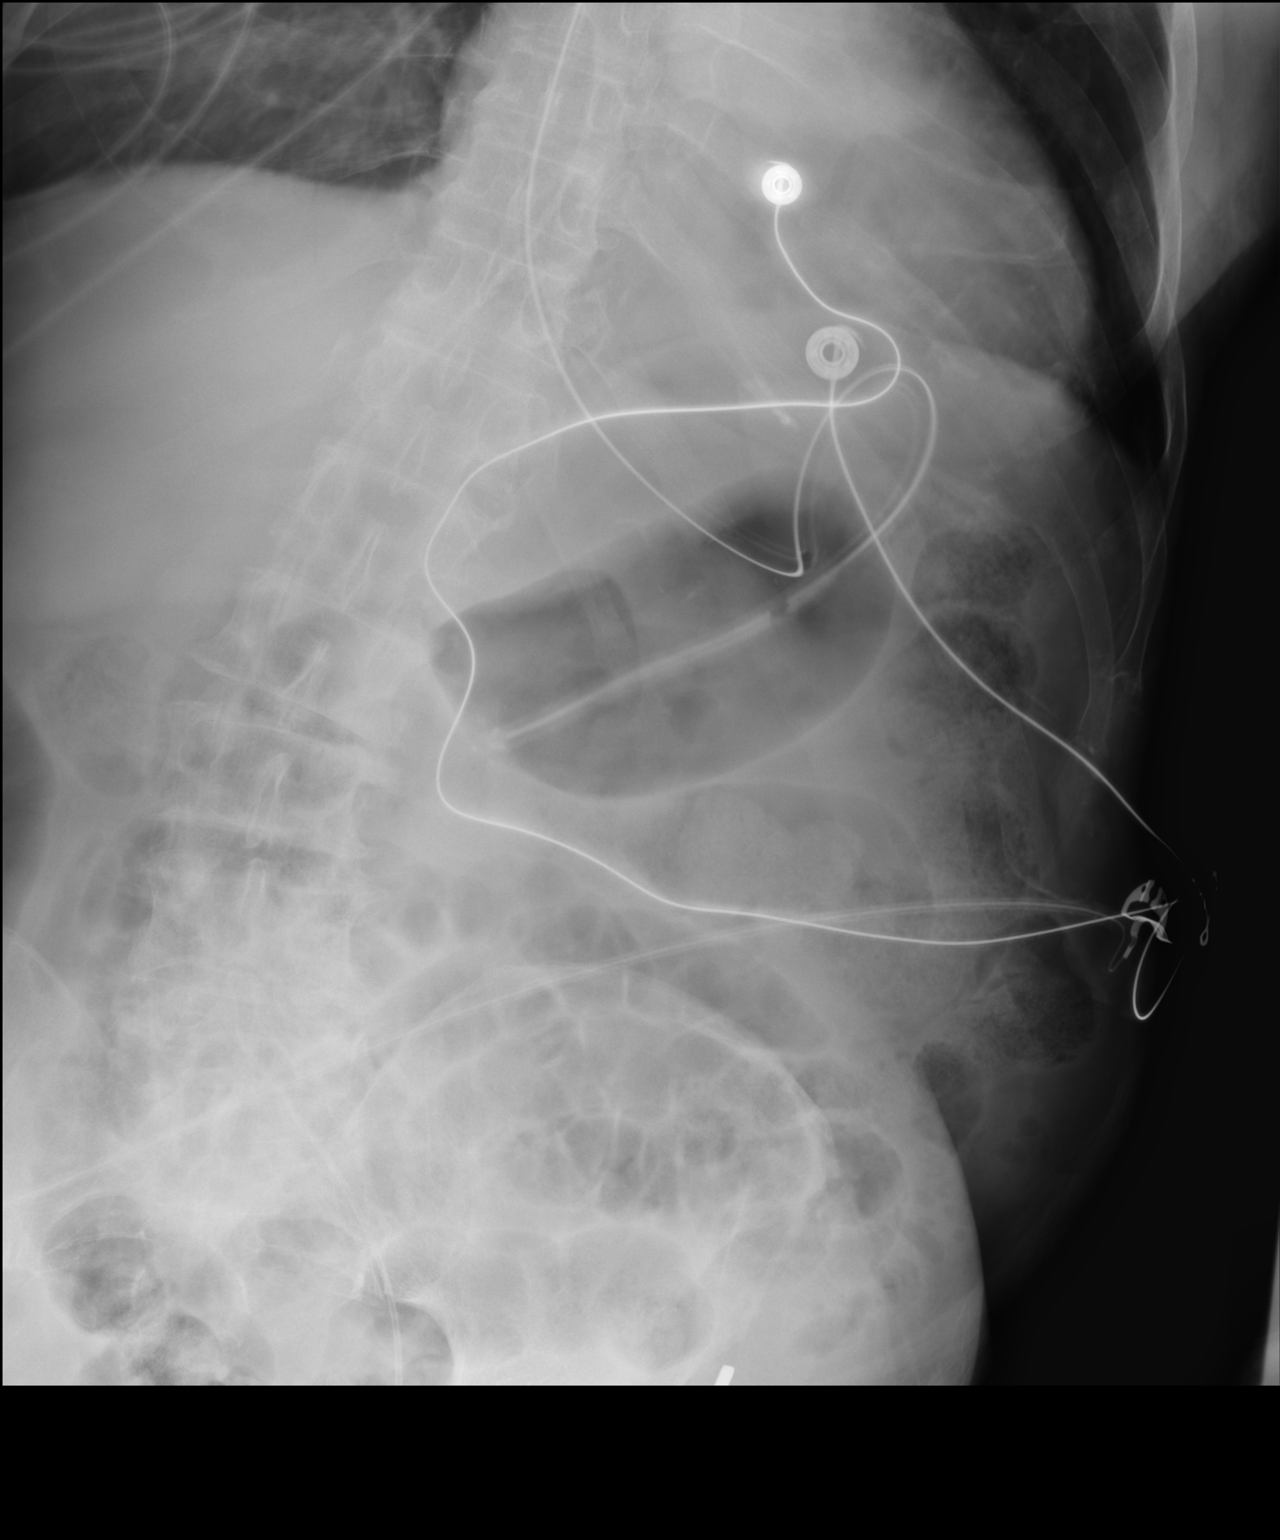

[1 of 1 positions shown; findings below may reference images not displayed]

FINDINGS: Interval advancement of enteric tube with tip and side-port now
projecting over the expected location of the gastric antrum.

Left femoral approach central venous catheter tip projects over the
lower lumbar spine.

Nonobstructive bowel gas pattern.
IMPRESSION: Interval advancement of enteric tube with tip and side port now
projected over the gastric antrum.
